# Patient Record
Sex: Female | Born: 1950 | Race: Black or African American | Hispanic: No | Marital: Married | Smoking: Never smoker
Health system: Southern US, Community
[De-identification: ages and names within clinical notes are randomized; demographics above are authoritative.]

## PROBLEM LIST (undated history)

## (undated) DIAGNOSIS — K219 Gastro-esophageal reflux disease without esophagitis: Secondary | ICD-10-CM

## (undated) DIAGNOSIS — E785 Hyperlipidemia, unspecified: Secondary | ICD-10-CM

## (undated) DIAGNOSIS — I10 Essential (primary) hypertension: Secondary | ICD-10-CM

## (undated) HISTORY — PX: ABDOMINAL HYSTERECTOMY: SHX81

---

## 2005-09-19 ENCOUNTER — Ambulatory Visit: Payer: Self-pay | Admitting: Family Medicine

## 2005-12-21 ENCOUNTER — Emergency Department (HOSPITAL_COMMUNITY): Admission: EM | Admit: 2005-12-21 | Discharge: 2005-12-21 | Payer: Self-pay | Admitting: Emergency Medicine

## 2007-04-30 ENCOUNTER — Ambulatory Visit: Payer: Self-pay | Admitting: Family Medicine

## 2007-04-30 DIAGNOSIS — N951 Menopausal and female climacteric states: Secondary | ICD-10-CM | POA: Insufficient documentation

## 2007-04-30 DIAGNOSIS — R5383 Other fatigue: Secondary | ICD-10-CM

## 2007-04-30 DIAGNOSIS — M129 Arthropathy, unspecified: Secondary | ICD-10-CM | POA: Insufficient documentation

## 2007-04-30 DIAGNOSIS — H269 Unspecified cataract: Secondary | ICD-10-CM

## 2007-04-30 DIAGNOSIS — J309 Allergic rhinitis, unspecified: Secondary | ICD-10-CM | POA: Insufficient documentation

## 2007-04-30 DIAGNOSIS — I10 Essential (primary) hypertension: Secondary | ICD-10-CM | POA: Insufficient documentation

## 2007-04-30 DIAGNOSIS — R5381 Other malaise: Secondary | ICD-10-CM

## 2007-04-30 DIAGNOSIS — K219 Gastro-esophageal reflux disease without esophagitis: Secondary | ICD-10-CM

## 2007-04-30 DIAGNOSIS — G43909 Migraine, unspecified, not intractable, without status migrainosus: Secondary | ICD-10-CM | POA: Insufficient documentation

## 2007-05-01 ENCOUNTER — Encounter (INDEPENDENT_AMBULATORY_CARE_PROVIDER_SITE_OTHER): Payer: Self-pay | Admitting: Family Medicine

## 2007-05-18 ENCOUNTER — Encounter (INDEPENDENT_AMBULATORY_CARE_PROVIDER_SITE_OTHER): Payer: Self-pay | Admitting: Family Medicine

## 2007-10-14 ENCOUNTER — Emergency Department (HOSPITAL_COMMUNITY): Admission: EM | Admit: 2007-10-14 | Discharge: 2007-10-14 | Payer: Self-pay | Admitting: Emergency Medicine

## 2008-02-27 ENCOUNTER — Ambulatory Visit: Payer: Self-pay | Admitting: Family Medicine

## 2008-02-27 ENCOUNTER — Telehealth (INDEPENDENT_AMBULATORY_CARE_PROVIDER_SITE_OTHER): Payer: Self-pay | Admitting: *Deleted

## 2008-02-27 ENCOUNTER — Ambulatory Visit (HOSPITAL_COMMUNITY): Admission: RE | Admit: 2008-02-27 | Discharge: 2008-02-27 | Payer: Self-pay | Admitting: Family Medicine

## 2008-02-27 DIAGNOSIS — M549 Dorsalgia, unspecified: Secondary | ICD-10-CM | POA: Insufficient documentation

## 2008-02-28 ENCOUNTER — Telehealth (INDEPENDENT_AMBULATORY_CARE_PROVIDER_SITE_OTHER): Payer: Self-pay | Admitting: *Deleted

## 2008-02-28 ENCOUNTER — Encounter (INDEPENDENT_AMBULATORY_CARE_PROVIDER_SITE_OTHER): Payer: Self-pay | Admitting: Family Medicine

## 2008-02-28 LAB — CONVERTED CEMR LAB
ALT: 17 units/L (ref 0–35)
AST: 16 units/L (ref 0–37)
Basophils Absolute: 0 10*3/uL (ref 0.0–0.1)
Basophils Relative: 1 % (ref 0–1)
Creatinine, Ser: 0.72 mg/dL (ref 0.40–1.20)
Eosinophils Relative: 1 % (ref 0–5)
HCT: 36.8 % (ref 36.0–46.0)
Hemoglobin: 12 g/dL (ref 12.0–15.0)
LDL Cholesterol: 113 mg/dL — ABNORMAL HIGH (ref 0–99)
MCHC: 32.6 g/dL (ref 30.0–36.0)
Monocytes Absolute: 0.3 10*3/uL (ref 0.1–1.0)
Neutro Abs: 1.8 10*3/uL (ref 1.7–7.7)
Platelets: 279 10*3/uL (ref 150–400)
RDW: 12.3 % (ref 11.5–15.5)
Sodium: 140 meq/L (ref 135–145)
TSH: 0.968 microintl units/mL (ref 0.350–5.50)
Total Bilirubin: 0.6 mg/dL (ref 0.3–1.2)
Total CHOL/HDL Ratio: 3.7
VLDL: 41 mg/dL — ABNORMAL HIGH (ref 0–40)

## 2008-05-07 ENCOUNTER — Emergency Department (HOSPITAL_COMMUNITY): Admission: EM | Admit: 2008-05-07 | Discharge: 2008-05-07 | Payer: Self-pay | Admitting: Emergency Medicine

## 2008-08-18 ENCOUNTER — Ambulatory Visit: Payer: Self-pay | Admitting: Cardiology

## 2008-08-19 ENCOUNTER — Observation Stay (HOSPITAL_COMMUNITY): Admission: EM | Admit: 2008-08-19 | Discharge: 2008-08-19 | Payer: Self-pay | Admitting: Emergency Medicine

## 2008-08-19 ENCOUNTER — Encounter (INDEPENDENT_AMBULATORY_CARE_PROVIDER_SITE_OTHER): Payer: Self-pay | Admitting: Internal Medicine

## 2008-08-21 ENCOUNTER — Encounter: Payer: Self-pay | Admitting: Cardiology

## 2008-08-21 ENCOUNTER — Ambulatory Visit (HOSPITAL_COMMUNITY): Admission: RE | Admit: 2008-08-21 | Discharge: 2008-08-21 | Payer: Self-pay | Admitting: Cardiology

## 2009-03-05 ENCOUNTER — Ambulatory Visit (HOSPITAL_COMMUNITY): Admission: RE | Admit: 2009-03-05 | Discharge: 2009-03-05 | Payer: Self-pay | Admitting: Internal Medicine

## 2009-03-05 ENCOUNTER — Encounter (INDEPENDENT_AMBULATORY_CARE_PROVIDER_SITE_OTHER): Payer: Self-pay | Admitting: Internal Medicine

## 2010-10-08 ENCOUNTER — Emergency Department (HOSPITAL_COMMUNITY): Admission: EM | Admit: 2010-10-08 | Discharge: 2010-10-08 | Payer: Self-pay | Admitting: Emergency Medicine

## 2010-12-12 ENCOUNTER — Encounter: Payer: Self-pay | Admitting: Internal Medicine

## 2010-12-12 ENCOUNTER — Encounter: Payer: Self-pay | Admitting: Family Medicine

## 2011-04-05 NOTE — Op Note (Signed)
NAMECAYLEY, PESTER           ACCOUNT NO.:  [id number]   MEDICAL RECORD NO.:  [id number]          PATIENT TYPE:  AMB   LOCATION:  DAY                           FACILITY:  [facility]   PHYSICIAN:  Lionel December, M.D.    DATE OF BIRTH:  [birth year]   DATE OF PROCEDURE:  DATE OF DISCHARGE:                               OPERATIVE REPORT   PROCEDURE:  Esophagogastroduodenoscopy.   INDICATION:  Arfa is a 60 year old African American female who has  had symptoms of GERD for about 2 years.  She has noted worsening  symptoms over the last 1 year.  She has also been experiencing some  chest pain.  She had noncardiac evaluation at [facility] during recent  hospitalization which was negative.  She is currently taking omeprazole  and still having heartburn, regurgitation as well as nocturnal coughing  spells.  She states she is not able to rest at night.  She denies  dysphagia, epigastric pain or melena.  She says she has changed her  eating habits completely and now she eats baked and boiled food.  She  does eat late at night.   Procedure was discussed with the patient and informed consent was  obtained.  After conscious sedation, oropharyngeal topical anesthesia  and Demerol 50 mg IV, Versed 8 mg IV in divided dose.   FINDINGS:  Procedure performed in endoscopy suite.  The patient's vital  signs and O2 sat were monitored during the procedure and remained  stable.  The patient was placed in the left lateral recumbent position  and the Pentax video scope was passed via oropharynx without any  difficulty into esophagus.   Esophagus mucosa:  The esophagus is normal.  No erosions or ulcers are  noted.  Gastroesophageal junction was located at 35 cm from the incision  and was serrated or wavy.  Biopsy was taken from this area on the way  out to rule out short-segment Barrett's.  Hiatus was at 38 cm.  Mucosa  of the herniated part of stomach was normal.   Stomach:  It was empty and distended  very well with insufflation.  Folds  of the proximal stomach are normal.  Examination of the mucosa at body  antrum:  Pyloric channel as well as annular fundus and cardio was  normal.   Duodenum:  Bulbar mucosa was normal.  Scope was passed in the second  part of the duodenum.  Mucosa and folds are normal.  Endoscope  withdrawn.  The patient tolerated the procedure well.   FINAL DIAGNOSES:  1. No evidence of erosive esophagitis.  2. Serrated or wavy gastroesophageal junction.  Biopsy taken to rule      out short-segment Barrett's.  3. Small sliding hiatal hernia.  4. Normal examination of the stomach, first and second part of the      duodenum.   RECOMMENDATIONS:  1. Antireflux measures reinforced.  2. The patient advised to eat her evening meal at least 3 hours before      she goes to bed.  3. Increase omeprazole to 20 mg before breakfast and before evening  meal.  New prescription given for 60 doses with 5 refills.  4. I will contact patient with results of biopsy and plan to see her      back in the office in 1 month.      Lionel December, M.D.  Electronically Signed     NR/MEDQ  D:  03/05/2009  T:  03/05/2009  Job:  [id number]   cc:   Mabeline Caras, MD

## 2011-04-05 NOTE — H&P (Signed)
Veronica Davis, [name]:  [id number]   MEDICAL RECORD NO.:  [id number]          PATIENT TYPE:  OBV   LOCATION:  ED99                          FACILITY:  [facility]   PHYSICIAN:  Osvaldo Shipper, MD     DATE OF BIRTH:  08-15-1951   DATE OF ADMISSION:  08/18/2008  DATE OF DISCHARGE:  LH                              HISTORY & PHYSICAL   PRIMARY CARE PHYSICIAN:  The patient's PMD, I think, is Veronica Davis, M.D. though it is very difficulty to say as the patient does  not know his name.   ADMITTING DIAGNOSES:  1. Chest pain.  2. History of hypertension.  3. Obesity.  4. Chronic back pain.  5. Hypokalemia.   CHIEF COMPLAINT:  Chest pain since this morning.   HISTORY OF THE PRESENT ILLNESS:  The patient is a 60 year old African  American female who has a past medical history of hypertension and  chronic back pain who was in her usual state of health until this  morning when she woke up, was helping her son go to school and she  suddenly experienced retrosternal chest pain, which was 10/10 in  intensity.  She felt as if somebody was sitting on her chest.  She had  one similar episode, she mentions, some time last year for which she  came into the ED and was diagnosed with acid reflux disease, and was  sent home.  The patient mentions that the pain persisted throughout the  day.  There is no radiation of the pain and she did not have any  shortness of breath.  The pain seems to increase when she lies down and  seems to change with different positions.  She sits down when she  experiences the pain.  She does not have any nausea or vomiting; though,  she did experience some palpitations.  She does have a small cough, but  no expectoration.  No fever or chills have been noted.   The patient, apart from the above-mentioned aggravating and relieving  aggravating factors, does not have any persistent factors.  No change in  the pain is noted with exertion.  The  patient mentions that she came  into the ED this evening as she did not have a ride to the emergency  department.  She was then given three nitroglycerins, after which she  had complete resolution of the pain.  She denies any food intake prior  to the onset of her pain.   MEDICATIONS:  The patient's medications at home include:  1. Aspirin 325 mg daily.  2. Naproxen 500 mg twice a day.  3. Hydrochlorothiazide 12.5 mg once a day.  4. The patient takes Premarin on a daily basis.  5. Motrin for migraines.   ALLERGIES:  The patient's allergies include PENICILLIN, WHICH CAUSES  SWELLING OF HER NECK.   PAST MEDICAL HISTORY:  The past medical history is positive for:  1. Hypertension.  2. Migraine headaches.  3. Chronic back pain.  4. The patient has hot flashes for which she is on Premarin.   PAST SURGICAL HISTORY:  The surgical history  includes a hysterectomy.   SOCIAL HISTORY:  The patient lives in [place] with her family.  She  is a Press photographer.  No smoking, no alcohol use and no illicit  drug use.  She is independent in her daily activities.   FAMILY HISTORY:  The patient's father had diabetes; however, he expired  due to unknown reasons.  Mother died of a heart attack in her 22s.  She  has two brothers whom she does not know much about their health history  and she has two sisters; one has some sort of unknown heart disease and  diabetes.   REVIEW OF SYSTEMS:  CONSTITUTIONAL:  General review of systems is  positive for weakness and malaise.  HEENT:  Unremarkable.  CARDIOVASCULAR: As noted in the HPI.  RESPIRATORY:  As noted in the HPI.  GASTROINTESTINAL:  Positive for symptoms of heartburn, etc, otherwise  unremarkable.  GENITOURINARY:  Unremarkable.  MUSCULOSKELETAL:  Positive  for back pain, which is chronic.  NEUROLOGIC:  Unremarkable.  PSYCHIATRIC:  Unremarkable.  DERMATOLOGIC:  Unremarkable.  Other systems  are unremarkable.   PHYSICAL EXAMINATION:  VITAL  SIGNS:  Temperature 98.4.  Blood pressure  when she came in was 180/94 and improving to 115/76.  Heart rate in the  80s.  Respiratory rate 14.  Saturation 100% on room air.  GENERAL APPEARANCE:  On general exam this is an obese black female  currently in no distress.  HEENT:  There is no pallor.  No icterus.  Oral mucous membranes are  moist.  No oral lesions are noted.  NECK:  The neck is soft and supple.  No thyromegaly is appreciated.  LUNGS:  The lungs are clear to auscultation bilaterally.  No wheezes,  rales or rhonchi.  HEART:  Cardiovascularly S1 and S2 are normal.  Regular.  No murmurs  appreciated.  No S3 o4 S4.  No rubs are heard.  ABDOMEN:  The abdomen is soft, nontender and nondistended.  Bowel sounds  are present.  No masses or organomegaly are appreciated.  CHEST:  In the chest wall there is  tenderness to palpation, but it is  not clear if the palpation reproduced the pain or not.  EXTREMITIES:  The extremities do not show any edema.  GENITALIA:  Genitourinary exam is deferred.  NEUROLOGIC EXAMINATION:  Neurologically she is alert and oriented times  three.  No focal neurological deficits are present.  MUSCULOSKELETAL:  The musculoskeletal exam is unremarkable.   LABORATORY DATA:  The patient has an unremarkable CBC.  She has a  potassium of 3.0 and glucose is 127; this is a nonfasting level.  Cardiac markers are negative times two.  She had a chest x-ray, which  did not show any acute cardiopulmonary process.  She had an EKG, which  showed a sinus rhythm with normal axis.  Intervals appear to be in the  normal range.  No Q waves are present.  No concerning ST or T wave  changes are seen on this EKG.   ASSESSMENT:  This is a 60 year old African American female who has a  past medical history of hypertension who is obese and presents with  chest pain.  The pain started this morning; if felt like somebody was  sitting on her chest.  The pain was relieved with  nitroglycerin.  So the  differential diagnosis at this time include:  1. Coronary artery disease.  2. Esophageal  spasm.  3. Acid reflux disease.  4. Musculoskeletal.  5. Biliary  process.   The patient needs observation in the hospital and further evaluation.   PLAN:  1. Chest pain.  As above, we will observe her in the hospital on      telemetry rule her out for an acute coronary syndrome with serial      cardiac markers.  EKGs will be repeated.  Lipid profile will be      checked.  TSH will be checked.  We will also consult [facility] because the chest pain was relieved with nitroglycerin.      She will likely need noninvasive cardiac testing either as an      inpatient or as an outpatient.  We will continue with the aspirin      and because her blood pressure is still a little bit high we will      initiate low-dose beta blockers.  We will also check LFTs and      lipase.  2. History of hypertension.  Continue current antihypertensive agents      and add beta blockers.  3. Hypokalemia.  We will check a magnesium level and replace the      potassium with by mouth Kay-Ciel.  4. DVT prophylaxis will be initiated.  Proton pump inhibitor will be      started as well.   Further management decisions will depend on the results of further  testing and the patient's response to treatment.      Osvaldo Shipper, MD  Electronically Signed     GK/MEDQ  D:  08/19/2008  T:  08/19/2008  Job:  [id number]   cc:   Veronica Davis, M.D.

## 2011-04-05 NOTE — Consult Note (Signed)
Veronica Davis, Veronica [name]:  [id number]   MEDICAL RECORD NO.:  [id number]          PATIENT TYPE:  OBV   LOCATION:  A306                          FACILITY:  [facility]   PHYSICIAN:  Gerrit Friends. Dietrich Pates, MD, FACCDATE OF BIRTH:  02-19-1951   DATE OF CONSULTATION:  08/19/2008  DATE OF DISCHARGE:  08/19/2008                                 CONSULTATION   REFERRING PHYSICIAN:  Dr. Osvaldo Shipper of the [facility] Hospitalist  Team P.   PATIENT'S PRIMARY CARE PHYSICIAN:  She is unaware of her name.   CARDIOLOGIST:  She will be new to Dr. Mingo Bing.   REASON FOR REFERRAL:  Chest pain.   HISTORY OF PRESENT ILLNESS:  Ms. Haviland is a 60 year old female  patient with no known history of coronary artery disease who presented  to [facility] Emergency Room last night with complaints of  chest pain.  Her pain began yesterday morning shortly after awakening.  The pain was intermittent and continued to recur throughout the day.  She denied any radiation or associated shortness of breath, nausea or  diaphoresis.  She denied any syncope.  She notes that the pain was at  its worst when she  would lay down.  She ate around 8 o'clock yesterday  evening and noted worsening pain around 9:30.  This prompted her to come  to the emergency room.  She apparently received nitroglycerin in the  emergency room with relief of her symptoms.  She had a similar episode  of chest pain about 2 years ago and was told that she had acid reflux  disease.  She is currently pain free.  Her cardiac enzymes have been  negative thus far and her electrocardiogram is unremarkable.  Her chest  x-ray shows no active disease.  We are asked to further evaluate.   PAST MEDICAL HISTORY:  1. Hypertension.  2. History of hyperlipidemia, untreated.  3. GERD.  4. Migraine headaches.  5. Chronic low back pain.  6. She is status post total abdominal hysterectomy.   MEDICATIONS PRIOR TO ADMISSION:  1.  Hydrochlorothiazide 12.5 mg daily.  2. Naprosyn 500 mg b.i.d.  3. Aspirin daily.  4. Premarin daily.   ALLERGIES:  PENICILLIN.   SOCIAL HISTORY:  The patient lives in [place] with her husband.  She  is disabled secondary to back pain.  She denies tobacco, alcohol or drug  abuse.   FAMILY HISTORY:  Significant for CAD.  Her mother died of a myocardial  infarction in her 91s.  Her father is deceased and had a history of  diabetes and hypertension.   REVIEW OF SYSTEMS:  Please see HPI.  She denies fevers, chills, dysuria,  hematuria, bright red blood per rectum, melena, dysphagia.  She notes  nonproductive cough.  She notes chronic edema without change.  She does  note some snoring with questionable daytime hypersomnolence.  She denies  orthopnea, PND or pedal edema.  Rest of the review of systems is  negative.   PHYSICAL EXAM:  She is a well-nourished, well-developed female.  Blood  pressure is 180/92 upon presentation to the emergency room  yesterday.  Now it is 149/74, pulse 74, respirations 18, temperature 98.4, oxygen  saturation 99% on room air.  HEENT:  Normal.  NECK:  Without JVD.  LYMPH:  Without lymphadenopathy.  ENDOCRINE:  Without thyromegaly.  CARDIAC:  Normal S1, S2.  Regular rate and rhythm with a 1/6 systolic  ejection murmur best heard at the right upper sternal border.  LUNGS:  Clear to auscultation bilaterally without wheezes.  SKIN:  Without rash.  ABDOMEN:  Soft with normoactive bowel sounds.  No organomegaly.  She  does have epigastric tenderness with palpation.  EXTREMITIES:  With trace edema bilaterally.  MUSCULOSKELETAL:  Without joint deformity.  NEUROLOGIC:  She is alert and oriented x3.  Cranial nerves II-XII  grossly intact.  VASCULAR:  Without carotid bruits bilaterally.   Chest x-ray:  No active disease.  EKG:  Normal sinus rhythm with a heart  rate of 71, no ischemic changes, normal axis.   LABORATORIES:  White count 5300, hemoglobin is 12,  hematocrit 34.1,  platelet count 297,000, potassium 3.3, creatinine 0.6. Coronary care CK-  MB 2.3, 1.6.  Point of care troponin-I less than 0.05, less than 0.05.  CK 127, CK-MB 2.6, troponin-I was 0.02, magnesium 0.5.   ASSESSMENT/PLAN:  1. Atypical chest pain in a 60 year old female patient with a history      of hypertension and remote family history of CAD.  She is currently      pain free and her cardiac enzymes have been negative so far.  Her      EKG is unremarkable.  TSH, lipid panel and 2-D echocardiogram are      currently pending.  She is currently on aspirin therapy.  She has      been interviewed and examined by Dr. Dietrich Pates.  At this point, he      has suggested that the patient may be discharged to home.  She will      be set up for an outpatient stress echocardiogram to rule out      ischemic heart disease.  2. Hypertension.  The patient has been started on metoprolol in      addition to her hydrochlorothiazide.   Thank you very much for the consultation.  As noted above, the patient  can be discharged to home from a cardiovascular standpoint with plans  for followup stress testing.      Tereso Newcomer, PA-C      Gerrit Friends. Dietrich Pates, MD, [facility]  Electronically Signed    SW/MEDQ  D:  08/19/2008  T:  08/19/2008  Job:  [id number]   cc:   Osvaldo Shipper, MD

## 2011-08-22 LAB — BASIC METABOLIC PANEL
CO2: 29
Calcium: 9.4
Creatinine, Ser: 0.61
GFR calc Af Amer: >60
GFR calc non Af Amer: >60
GFR calc non Af Amer: >60
Potassium: 3.3 — ABNORMAL LOW
Sodium: 139

## 2011-08-22 LAB — LIPID PANEL
Cholesterol: 210 — ABNORMAL HIGH
Total CHOL/HDL Ratio: 4.8
VLDL: 38

## 2011-08-22 LAB — POCT CARDIAC MARKERS
Myoglobin, poc: 55.4
Myoglobin, poc: 73.6
Troponin i, poc: <0.05

## 2011-08-22 LAB — DIFFERENTIAL
Basophils Relative: 0
Monocytes Relative: 6
Neutro Abs: 2.6
Neutrophils Relative %: 49

## 2011-08-22 LAB — CK TOTAL AND CKMB (NOT AT ARMC)
CK, MB: 2.6
Relative Index: 2
Total CK: 127

## 2011-08-22 LAB — MAGNESIUM: Magnesium: 2.5

## 2011-08-22 LAB — CBC
MCHC: 35.3
RBC: 3.86 — ABNORMAL LOW
WBC: 5.3

## 2011-08-22 LAB — HEPATIC FUNCTION PANEL
AST: 21
Albumin: 3.9
Total Bilirubin: 0.7

## 2011-08-22 LAB — TROPONIN I: Troponin I: 0.02

## 2016-09-21 ENCOUNTER — Emergency Department (HOSPITAL_COMMUNITY)
Admission: EM | Admit: 2016-09-21 | Discharge: 2016-09-21 | Disposition: A | Discharge status: Home / Self Care | Payer: Medicare Other | Attending: Emergency Medicine | Admitting: Emergency Medicine

## 2016-09-21 ENCOUNTER — Encounter (HOSPITAL_COMMUNITY): Payer: Self-pay | Admitting: *Deleted

## 2016-09-21 DIAGNOSIS — Z79899 Other long term (current) drug therapy: Secondary | ICD-10-CM | POA: Insufficient documentation

## 2016-09-21 DIAGNOSIS — L0231 Cutaneous abscess of buttock: Secondary | ICD-10-CM | POA: Diagnosis not present

## 2016-09-21 DIAGNOSIS — I10 Essential (primary) hypertension: Secondary | ICD-10-CM | POA: Insufficient documentation

## 2016-09-21 DIAGNOSIS — Z7982 Long term (current) use of aspirin: Secondary | ICD-10-CM | POA: Diagnosis not present

## 2016-09-21 HISTORY — DX: Gastro-esophageal reflux disease without esophagitis: K21.9

## 2016-09-21 MED ORDER — DOXYCYCLINE HYCLATE 100 MG PO CAPS: 100.0000 mg | ORAL_CAPSULE | Freq: Two times a day (BID) | ORAL | 0 refills | Status: DC

## 2016-09-21 MED ORDER — HYDROCODONE-ACETAMINOPHEN 5-325 MG PO TABS
1.0000 | ORAL_TABLET | Freq: Once | ORAL | Status: AC
  Administered 2016-09-21: 1 via ORAL
  Filled 2016-09-21: qty 1

## 2016-09-21 MED ORDER — LIDOCAINE HCL (PF) 1 % IJ SOLN
5.0000 mL | Freq: Once | INTRAMUSCULAR | Status: DC
  Filled 2016-09-21: qty 5

## 2016-09-21 MED ORDER — DOXYCYCLINE HYCLATE 100 MG PO TABS
100.0000 mg | ORAL_TABLET | Freq: Once | ORAL | Status: AC
  Administered 2016-09-21: 100 mg via ORAL
  Filled 2016-09-21: qty 1

## 2016-09-21 NOTE — Discharge Instructions (Signed)
Please take the antibiotics twice a day for 7 days. Return to the Ed in 2-3 days for wound recheck. Follow up with a pcp to establish care. Return to the Ed sooner if your symptoms worsen or if you develop a fever, vomiting, worsening pain, redness or swelling.

## 2016-09-21 NOTE — ED Triage Notes (Signed)
Pt reports very painful "boil" on her posterior left thigh near her butttocks with draining fluid x 4 days.

## 2016-09-21 NOTE — ED Provider Notes (Signed)
[facility]-EMERGENCY DEPT Provider Note   CSN: [id number] Arrival date & time: 09/21/16  1801     History   Chief Complaint Chief Complaint  Patient presents with  . Abscess    HPI Veronica Davis is a 65 y.o. female.  65 year old African-American female with no significant past medical history presents to the ED today with abscess to left inferior buttock's. Patient states that she noticed a boil there approximately one week ago. States that it started draining approximately 4 days ago on its own. She denies any trauma to the area. She denies any fevers or chills. Patient has not tried anything at home for the pain. The pain is acute in onset and gradually worsening. Sitting on her butt makes the pain worse. Nothing makes the pain better. She denies any other complaints.  Patient with history of hypertensin and not currently on meds.      Past Medical History:  Diagnosis Date  . GERD (gastroesophageal reflux disease)     Patient Active Problem List   Diagnosis Date Noted  . BACK PAIN 02/27/2008  . MIGRAINE HEADACHE 04/30/2007  . 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 HYPERTENSION 04/30/2007  . ALLERGIC RHINITIS 04/30/2007  . GERD 04/30/2007  . HOT FLASHES 04/30/2007  . ARTHRITIS 04/30/2007  . MALAISE AND FATIGUE 04/30/2007    Past Surgical History:  Procedure Laterality Date  . ABDOMINAL HYSTERECTOMY      OB History    No data available       Home Medications    Prior to Admission medications   Medication Sig Start Date End Date Taking? Authorizing Provider  aspirin EC 81 MG tablet Take 81 mg by mouth daily.   Yes Historical Provider, MD  Iron-Vitamins (GERITOL COMPLETE) TABS Take 1 tablet by mouth daily.   Yes Historical Provider, MD    Family History No family history on file.  Social History Social History  Substance Use Topics  . Smoking status: Never Smoker  . Smokeless tobacco: Never Used  . Alcohol use No     Allergies   Penicillins   Review of  Systems Review of Systems  Constitutional: Negative for chills and fever.  HENT: Negative for congestion and rhinorrhea.   Eyes: Negative.   Respiratory: Negative for cough.   Cardiovascular: Negative for chest pain.  Gastrointestinal: Negative for abdominal pain, diarrhea, nausea and vomiting.  Genitourinary: Negative.   Musculoskeletal: Negative.   Skin: Positive for wound.  Neurological: Negative for dizziness and headaches.  All other systems reviewed and are negative.    Physical Exam Updated Vital Signs BP 145/73   Pulse 110   Temp 98.9 F (37.2 C) (Oral)   Resp 17   Ht 5\' 4"  (1.626 m)   Wt 82.5 kg   SpO2 98%   BMI 31.21 kg/m   Physical Exam  Constitutional: She appears well-developed and well-nourished. No distress.  Eyes: Right eye exhibits no discharge. Left eye exhibits no discharge. No scleral icterus.  Pulmonary/Chest: No respiratory distress.  Musculoskeletal: Normal range of motion.  Neurological: She is alert.  Skin: Skin is warm and dry. Capillary refill takes less than 2 seconds. No pallor.     Nursing note and vitals reviewed.    ED Treatments / Results  Labs (all labs ordered are listed, but only abnormal results are displayed) Labs Reviewed - No data to display  EKG  EKG Interpretation None       Radiology No results found.  Procedures .Marland Kitchen.Incision and Drainage Date/Time: 09/22/2016  9:44 PM Performed by: Rise MuLEAPHART, Nataliee Shurtz T Authorized by: Demetrios LollLEAPHART, Destinee Taber T   Consent:    Consent obtained:  Verbal   Consent given by:  Patient   Risks discussed:  Bleeding, damage to other organs, infection, incomplete drainage and pain   Alternatives discussed:  No treatment Location:    Type:  Abscess   Size:  6cm   Location: left inner groin. Pre-procedure details:    Skin preparation:  Betadine Anesthesia (see MAR for exact dosages):    Anesthesia method:  Local infiltration   Local anesthetic:  Lidocaine 1% w/o epi Procedure type:     Complexity:  Simple Procedure details:    Needle aspiration: no     Incision types:  Single straight   Incision depth:  Dermal   Scalpel blade:  11   Wound management:  Probed and deloculated and irrigated with saline   Drainage:  Bloody and purulent   Drainage amount:  Moderate   Wound treatment:  Wound left open   Packing materials:  1/4 in iodoform gauze   Amount 1/4" iodoform:  2in Post-procedure details:    Patient tolerance of procedure:  Tolerated well, no immediate complications Comments:     Patient provided wound care and encouraged to return to ED in 2-3 days for wound recheck   (including critical care time)  Medications Ordered in ED Medications  HYDROcodone-acetaminophen (NORCO/VICODIN) 5-325 MG per tablet 1 tablet (1 tablet Oral Given 09/21/16 2351)  doxycycline (VIBRA-TABS) tablet 100 mg (100 mg Oral Given 09/21/16 2350)     Initial Impression / Assessment and Plan / ED Course  I have reviewed the triage vital signs and the nursing notes.  Pertinent labs & imaging results that were available during my care of the patient were reviewed by me and considered in my medical decision making (see chart for details).  Clinical Course  Patient with skin abscess amenable to incision and drainage.  Packing placed. Wound recheck in 2 days. Encouraged home warm soaks and flushing.  Mild signs of cellulitis is surrounding skin. Given location and size of cellulitis started patient on abx. Patient with pen allergies. Spoke with pharmacist who recommends doxy as second line. Patient tachycardic and hypertension in ED. Likely due to pain. Patient is afebrile. History of htn not on meds. Encouraged her to follow up with pcp for blood pressure recheck. Patient verbalized understanding with plan of care. Discharged home in NAD. Strict return precautions given.    Final Clinical Impressions(s) / ED Diagnoses   Final diagnoses:  Abscess of buttock, right    New  Prescriptions Discharge Medication List as of 09/21/2016 11:44 PM    START taking these medications   Details  doxycycline (VIBRAMYCIN) 100 MG capsule Take 1 capsule (100 mg total) by mouth 2 (two) times daily., Starting Wed 09/21/2016, Print         Rise MuKenneth T Kaye Luoma, PA-C 09/22/16 2150    Jacalyn LefevreJulie Haviland, MD 09/22/16 [phone]

## 2016-10-06 ENCOUNTER — Emergency Department (HOSPITAL_COMMUNITY)
Admission: EM | Admit: 2016-10-06 | Discharge: 2016-10-06 | Disposition: A | Discharge status: Home / Self Care | Payer: Medicare Other | Attending: Emergency Medicine | Admitting: Emergency Medicine

## 2016-10-06 ENCOUNTER — Encounter (HOSPITAL_COMMUNITY): Payer: Self-pay | Admitting: Emergency Medicine

## 2016-10-06 DIAGNOSIS — Z79899 Other long term (current) drug therapy: Secondary | ICD-10-CM | POA: Diagnosis not present

## 2016-10-06 DIAGNOSIS — Z7982 Long term (current) use of aspirin: Secondary | ICD-10-CM | POA: Insufficient documentation

## 2016-10-06 DIAGNOSIS — I10 Essential (primary) hypertension: Secondary | ICD-10-CM | POA: Insufficient documentation

## 2016-10-06 DIAGNOSIS — Z4801 Encounter for change or removal of surgical wound dressing: Secondary | ICD-10-CM | POA: Diagnosis not present

## 2016-10-06 DIAGNOSIS — Z4800 Encounter for change or removal of nonsurgical wound dressing: Secondary | ICD-10-CM | POA: Diagnosis not present

## 2016-10-06 DIAGNOSIS — Z5189 Encounter for other specified aftercare: Secondary | ICD-10-CM

## 2016-10-06 MED ORDER — IBUPROFEN 400 MG PO TABS
400.0000 mg | ORAL_TABLET | Freq: Four times a day (QID) | ORAL | 0 refills | Status: DC | PRN
Start: 2016-10-06 — End: 2020-11-03

## 2016-10-06 NOTE — ED Provider Notes (Signed)
[facility] DEPT Provider Note   CSN: [id number] Arrival date & time: 10/06/16  1049  History   Chief Complaint Chief Complaint  Patient presents with  . Abscess    HPI Veronica Davis is a 65 y.o. female.  HPI  65 y.o. female presents to the Emergency Department today four wound check. Seen on 09-21-16 for abscess behind left leg. Drained in ED and given Rx Doxy for ABX. Unable to get check in 2-3 days due to transportation. Presents today due to continued pain. Notes pain 10/10. No purulence from wound. No erythema. NO fevers at home. No N/V. Notes using hydrogen peroxide daily for wound as well as topical neosporin. No other symptoms noted.     Past Medical History:  Diagnosis Date  . GERD (gastroesophageal reflux disease)     Patient Active Problem List   Diagnosis Date Noted  . BACK PAIN 02/27/2008  . MIGRAINE HEADACHE 04/30/2007  . 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 HYPERTENSION 04/30/2007  . ALLERGIC RHINITIS 04/30/2007  . GERD 04/30/2007  . HOT FLASHES 04/30/2007  . ARTHRITIS 04/30/2007  . MALAISE AND FATIGUE 04/30/2007    Past Surgical History:  Procedure Laterality Date  . ABDOMINAL HYSTERECTOMY      OB History    No data available       Home Medications    Prior to Admission medications   Medication Sig Start Date End Date Taking? Authorizing Provider  aspirin EC 81 MG tablet Take 81 mg by mouth daily.    Historical Provider, MD  doxycycline (VIBRAMYCIN) 100 MG capsule Take 1 capsule (100 mg total) by mouth 2 (two) times daily. 09/21/16   Rise MuKenneth T Leaphart, PA-C  Iron-Vitamins (GERITOL COMPLETE) TABS Take 1 tablet by mouth daily.    Historical Provider, MD    Family History History reviewed. No pertinent family history.  Social History Social History  Substance Use Topics  . Smoking status: Never Smoker  . Smokeless tobacco: Never Used  . Alcohol use No     Allergies   Penicillins   Review of Systems Review of Systems    Constitutional: Negative for fever.  Gastrointestinal: Negative for nausea and vomiting.  Skin: Positive for wound.   Physical Exam Updated Vital Signs BP 167/91 (BP Location: Left Arm)   Pulse 94   Temp 98.1 F (36.7 C) (Oral)   Resp 18   Ht 5\' 4"  (1.626 m)   Wt 82.1 kg   SpO2 100%   BMI 31.07 kg/m   Physical Exam  Constitutional: She is oriented to person, place, and time. Vital signs are normal. She appears well-developed and well-nourished.  HENT:  Head: Normocephalic.  Right Ear: Hearing normal.  Left Ear: Hearing normal.  Eyes: Conjunctivae and EOM are normal. Pupils are equal, round, and reactive to light.  Neck: Normal range of motion. Neck supple.  Cardiovascular: Normal rate, regular rhythm, normal heart sounds and intact distal pulses.   Pulmonary/Chest: Effort normal and breath sounds normal.  Abdominal: Soft.  Neurological: She is alert and oriented to person, place, and time.  Skin: Skin is warm and dry.  Left poster superior leg with well healed Incision and Drainage site. No surrounding erythema. No induration or fluctuance palpable. NO drainage. No signs of infection.   Psychiatric: She has a normal mood and affect. Her speech is normal and behavior is normal. Thought content normal.  Nursing note and vitals reviewed.  ED Treatments / Results  Labs (all labs ordered are listed, but only  abnormal results are displayed) Labs Reviewed - No data to display  EKG  EKG Interpretation None       Radiology No results found.  Procedures Procedures (including critical care time)  Medications Ordered in ED Medications - No data to display   Initial Impression / Assessment and Plan / ED Course  I have reviewed the triage vital signs and the nursing notes.  Pertinent labs & imaging results that were available during my care of the patient were reviewed by me and considered in my medical decision making (see chart for details).  Clinical Course     Final Clinical Impressions(s) / ED Diagnoses  I have reviewed the relevant previous healthcare records. I obtained HPI from historian.  ED Course:  Assessment: Pt is a 65yF who presents for wound check from previous I and D on 09-21-16. Given Doxy on DC. On exam, pt in NAD. Nontoxic/nonseptic appearing. VSS. Afebrile. Lungs CTA. Heart RRR. Are of concern well healed. Minor wound present. No surrounding erythema. No purulence. No induration or fluctuance palpable. Counseled on DC of hydrogen peroxide. Neosporin only with clean bandage. Wash daily. Plan is to DC home with follow up to PCP. At time of discharge, Patient is in no acute distress. Vital Signs are stable. Patient is able to ambulate. Patient able to tolerate PO.   Disposition/Plan:  DC Home Additional Verbal discharge instructions given and discussed with patient.  Pt Instructed to f/u with PCP in the next week for evaluation and treatment of symptoms. Return precautions given Pt acknowledges and agrees with plan  Supervising Physician Samuel JesterKathleen McManus, DO   Final diagnoses:  Wound check, abscess    New Prescriptions New Prescriptions   No medications on file     Audry Piliyler Leiam Hopwood, PA-C 10/06/16 1149    Samuel JesterKathleen McManus, DO 10/08/16 2025

## 2016-10-06 NOTE — Discharge Instructions (Signed)
Please read and follow all provided instructions.  Your diagnoses today include:  1. Wound check, abscess    Tests performed today include: Vital signs. See below for your results today.   Medications prescribed:  Take as prescribed   Home care instructions:  Follow any educational materials contained in this packet.  Follow-up instructions: Please follow-up with your primary care provider for further evaluation of symptoms and treatment   Return instructions:  Please return to the Emergency Department if you do not get better, if you get worse, or new symptoms OR  - Fever (temperature greater than 101.59F)  - Bleeding that does not stop with holding pressure to the area    -Severe pain (please note that you may be more sore the day after your accident)  - Chest Pain  - Difficulty breathing  - Severe nausea or vomiting  - Inability to tolerate food and liquids  - Passing out  - Skin becoming red around your wounds  - Change in mental status (confusion or lethargy)  - New numbness or weakness    Please return if you have any other emergent concerns.  Additional Information:  Your vital signs today were: BP 167/91 (BP Location: Left Arm)    Pulse 94    Temp 98.1 F (36.7 C) (Oral)    Resp 18    Ht 5\' 4"  (1.626 m)    Wt 82.1 kg    SpO2 100%    BMI 31.07 kg/m  If your blood pressure (BP) was elevated above 135/85 this visit, please have this repeated by your doctor within one month. ---------------

## 2016-10-06 NOTE — ED Notes (Signed)
Patient given discharge instruction, verbalized understand. Patient ambulatory out of the department.  

## 2016-10-06 NOTE — ED Triage Notes (Signed)
Pt states she is here for a recheck of the boil on her left leg.  States it does not seem to be any better.

## 2017-08-10 DIAGNOSIS — I10 Essential (primary) hypertension: Secondary | ICD-10-CM | POA: Diagnosis not present

## 2017-08-10 DIAGNOSIS — Z90711 Acquired absence of uterus with remaining cervical stump: Secondary | ICD-10-CM | POA: Diagnosis not present

## 2017-08-10 DIAGNOSIS — M539 Dorsopathy, unspecified: Secondary | ICD-10-CM | POA: Diagnosis not present

## 2017-08-10 DIAGNOSIS — K219 Gastro-esophageal reflux disease without esophagitis: Secondary | ICD-10-CM | POA: Diagnosis not present

## 2020-10-05 ENCOUNTER — Other Ambulatory Visit: Payer: Self-pay

## 2020-10-05 ENCOUNTER — Ambulatory Visit (INDEPENDENT_AMBULATORY_CARE_PROVIDER_SITE_OTHER): Payer: Medicare Other | Admitting: Internal Medicine

## 2020-10-05 ENCOUNTER — Encounter: Payer: Self-pay | Admitting: Internal Medicine

## 2020-10-05 VITALS — BP 188/95 | HR 96 | Temp 97.7°F | Resp 18 | Ht 64.0 in | Wt 186.0 lb

## 2020-10-05 DIAGNOSIS — Z7689 Persons encountering health services in other specified circumstances: Secondary | ICD-10-CM | POA: Diagnosis not present

## 2020-10-05 DIAGNOSIS — G8929 Other chronic pain: Secondary | ICD-10-CM | POA: Diagnosis not present

## 2020-10-05 DIAGNOSIS — I10 Essential (primary) hypertension: Secondary | ICD-10-CM

## 2020-10-05 DIAGNOSIS — M545 Low back pain, unspecified: Secondary | ICD-10-CM | POA: Diagnosis not present

## 2020-10-05 DIAGNOSIS — Z1231 Encounter for screening mammogram for malignant neoplasm of breast: Secondary | ICD-10-CM

## 2020-10-05 DIAGNOSIS — Z1211 Encounter for screening for malignant neoplasm of colon: Secondary | ICD-10-CM | POA: Diagnosis not present

## 2020-10-05 DIAGNOSIS — N951 Menopausal and female climacteric states: Secondary | ICD-10-CM | POA: Diagnosis not present

## 2020-10-05 DIAGNOSIS — Z780 Asymptomatic menopausal state: Secondary | ICD-10-CM | POA: Diagnosis not present

## 2020-10-05 DIAGNOSIS — Z23 Encounter for immunization: Secondary | ICD-10-CM | POA: Diagnosis not present

## 2020-10-05 MED ORDER — DULOXETINE HCL 30 MG PO CPEP
30.0000 mg | ORAL_CAPSULE | Freq: Every day | ORAL | 3 refills | Status: DC
Start: 2020-10-05 — End: 2020-10-12

## 2020-10-05 MED ORDER — ESTRADIOL 1 MG PO TABS: 1.0000 mg | ORAL_TABLET | Freq: Every day | ORAL | 2 refills | Status: DC

## 2020-10-05 MED ORDER — OLMESARTAN-AMLODIPINE-HCTZ 40-5-25 MG PO TABS: 1.0000 | ORAL_TABLET | Freq: Every day | ORAL | 3 refills | Status: DC

## 2020-10-05 NOTE — Assessment & Plan Note (Signed)
Care established Previous chart reviewed History and medications reviewed with the patient 

## 2020-10-05 NOTE — Assessment & Plan Note (Signed)
Related to menopause Started Estradiol 1 mg QD

## 2020-10-05 NOTE — Assessment & Plan Note (Addendum)
BP Readings from Last 1 Encounters:  10/05/20 (!) 188/95   No symptoms of end-organ damage Started Olmesartan-Amlodipine-HCTZ Counseled for compliance with the medications Advised DASH diet and moderate exercise/walking, at least 150 mins/week  EKG: Sinus rhythm. HR 93. Nonspecific ST and T wave abnormality.

## 2020-10-05 NOTE — Progress Notes (Signed)
New Patient Office Visit  Subjective:  Patient ID: Veronica Davis, female    DOB: 1951-04-29  Age: 69 y.o. MRN: [id number]  CC:  Chief Complaint  Patient presents with  . New Patient (Initial Visit)    new pt has been having hot flashes and needs something for this     HPI [name] is a 69 year old female with PMH of GERD and chronic low back pain who presents for establishing care.  Patient states that she has been in good health overall.  She denies any active complaints except chronic low back pain.  Patient's BP was 188/95 in the office today.  On repeat check, it was 195/100.  Patient mentions that she was told about high BP in the past and was taking her medication many years ago.  She currently takes aspirin 81 mg once daily.  She mentions headaches, which are generalized, intermittent, not associated with blurry vision, photophobia or phonophobia or watery eyes.  Of note, she mentions history of migraine in the past.  She denies any dizziness, chest pain, dyspnea or palpitations.  She complains of chronic low back pain, which is intermittent, sharp, with occasional radiation to left LE.  She takes ibuprofen as needed for back pain.  She denies any recent injury, lifting any heavy weight, urinary or bowel incontinence.  Patient denies any recent weight loss.  She has not had any colonoscopy yet.  Last Mammography about 5 years ago.  Patient has had both doses of COVID vaccines.  Patient received flu vaccine in the office today.    Past Medical History:  Diagnosis Date  . GERD (gastroesophageal reflux disease)     Past Surgical History:  Procedure Laterality Date  . ABDOMINAL HYSTERECTOMY      History reviewed. No pertinent family history.  Social History   Socioeconomic History  . Marital status: Married    Spouse name: Not on file  . Number of children: Not on file  . Years of education: Not on file  . Highest education level: Not on file   Occupational History  . Not on file  Tobacco Use  . Smoking status: Never Smoker  . Smokeless tobacco: Never Used  Substance and Sexual Activity  . Alcohol use: No  . Drug use: No  . Sexual activity: Not on file  Other Topics Concern  . Not on file  Social History Narrative  . Not on file   Social Determinants of Health   Financial Resource Strain:   . Difficulty of Paying Living Expenses: Not on file  Food Insecurity:   . Worried About Charity fundraiser in the Last Year: Not on file  . Ran Out of Food in the Last Year: Not on file  Transportation Needs:   . Lack of Transportation (Medical): Not on file  . Lack of Transportation (Non-Medical): Not on file  Physical Activity:   . Days of Exercise per Week: Not on file  . Minutes of Exercise per Session: Not on file  Stress:   . Feeling of Stress : Not on file  Social Connections:   . Frequency of Communication with Friends and Family: Not on file  . Frequency of Social Gatherings with Friends and Family: Not on file  . Attends Religious Services: Not on file  . Active Member of Clubs or Organizations: Not on file  . Attends Archivist Meetings: Not on file  . Marital Status: Not on file  Intimate Partner Violence:   .  Fear of Current or Ex-Partner: Not on file  . Emotionally Abused: Not on file  . Physically Abused: Not on file  . Sexually Abused: Not on file    ROS Review of Systems  Constitutional: Negative for chills and fever.  HENT: Negative for congestion, sinus pressure, sinus pain and sore throat.   Eyes: Negative for pain and discharge.  Respiratory: Negative for cough and shortness of breath.   Cardiovascular: Negative for chest pain and palpitations.  Gastrointestinal: Negative for abdominal pain, constipation, diarrhea, nausea and vomiting.  Endocrine: Negative for polydipsia and polyuria.  Genitourinary: Negative for dysuria and hematuria.  Musculoskeletal: Positive for back pain.  Negative for neck pain and neck stiffness.  Skin: Negative for rash.  Neurological: Negative for dizziness, syncope, weakness and numbness.  Psychiatric/Behavioral: Negative for agitation and behavioral problems.    Objective:   Today's Vitals: BP (!) 188/95 (BP Location: Right Arm, Patient Position: Sitting, Cuff Size: Normal)   Pulse 96   Temp 97.7 F (36.5 C) (Temporal)   Resp 18   Ht 5' 4"  (1.626 m)   Wt 186 lb (84.4 kg)   SpO2 99%   BMI 31.93 kg/m   Physical Exam Vitals reviewed.  Constitutional:      General: She is not in acute distress.    Appearance: She is not diaphoretic.  HENT:     Head: Normocephalic and atraumatic.     Nose: Nose normal.     Mouth/Throat:     Mouth: Mucous membranes are moist.  Eyes:     General: No scleral icterus.    Extraocular Movements: Extraocular movements intact.     Pupils: Pupils are equal, round, and reactive to light.  Cardiovascular:     Rate and Rhythm: Normal rate and regular rhythm.     Pulses: Normal pulses.     Heart sounds: Normal heart sounds. No murmur heard.   Pulmonary:     Breath sounds: Normal breath sounds. No wheezing or rales.  Abdominal:     Palpations: Abdomen is soft.     Tenderness: There is no abdominal tenderness.  Musculoskeletal:     Cervical back: Neck supple. No tenderness.     Right lower leg: No edema.     Left lower leg: No edema.  Skin:    General: Skin is warm.     Findings: No rash.  Neurological:     General: No focal deficit present.     Mental Status: She is alert and oriented to person, place, and time.     Sensory: No sensory deficit.     Motor: No weakness.  Psychiatric:        Mood and Affect: Mood normal.        Behavior: Behavior normal.     Assessment & Plan:   Problem List Items Addressed This Visit      Cardiovascular and Mediastinum   Malignant hypertension    BP Readings from Last 1 Encounters:  10/05/20 (!) 188/95   No symptoms of end-organ damage Started  Olmesartan-Amlodipine-HCTZ Counseled for compliance with the medications Advised DASH diet and moderate exercise/walking, at least 150 mins/week  EKG: Sinus rhythm. HR 93. Nonspecific ST and T wave abnormality.       Relevant Medications   Olmesartan-amLODIPine-HCTZ 40-5-25 MG TABS   Other Relevant Orders   CMP14+EGFR     Other   HOT FLASHES    Related to menopause Started Estradiol 1 mg QD      Relevant Medications  DULoxetine (CYMBALTA) 30 MG capsule   estradiol (ESTRACE) 1 MG tablet   Chronic low back pain    With occasional radiation to the left LE Advised simple back exercises Tylenol PRN Cymbalta for neuropathic pain      Relevant Medications   DULoxetine (CYMBALTA) 30 MG capsule   Encounter to establish care    Care established Previous chart reviewed History and medications reviewed with the patient       Other Visit Diagnoses    Establishing care with new doctor, encounter for    -  Primary   Relevant Orders   CBC   CMP14+EGFR   Lipid panel   TSH   VITAMIN D 25 Hydroxy (Vit-D Deficiency, Fractures)   Need for immunization against influenza       Relevant Orders   Flu Vaccine QUAD High Dose(Fluad) (Completed)   Screening mammogram, encounter for       Relevant Orders   MM Digital Screening   Special screening for malignant neoplasms, colon       Relevant Orders   Ambulatory referral to Gastroenterology   Post-menopausal       Relevant Orders   DG Bone Density      Outpatient Encounter Medications as of 10/05/2020  Medication Sig  . aspirin EC 81 MG tablet Take 81 mg by mouth daily.  . Iron-Vitamins (GERITOL COMPLETE) TABS Take 1 tablet by mouth daily.  . DULoxetine (CYMBALTA) 30 MG capsule Take 1 capsule (30 mg total) by mouth daily.  Marland Kitchen estradiol (ESTRACE) 1 MG tablet Take 1 tablet (1 mg total) by mouth daily.  Marland Kitchen ibuprofen (ADVIL,MOTRIN) 400 MG tablet Take 1 tablet (400 mg total) by mouth every 6 (six) hours as needed. (Patient not taking:  Reported on 10/05/2020)  . Olmesartan-amLODIPine-HCTZ 40-5-25 MG TABS Take 1 tablet by mouth daily.  . [DISCONTINUED] doxycycline (VIBRAMYCIN) 100 MG capsule Take 1 capsule (100 mg total) by mouth 2 (two) times daily. (Patient not taking: Reported on 10/05/2020)   No facility-administered encounter medications on file as of 10/05/2020.    Follow-up: Return in about 4 weeks (around 11/02/2020).   Lindell Spar, MD

## 2020-10-05 NOTE — Patient Instructions (Addendum)
Please start taking medications as prescribed.  Please follow DASH diet and perform moderate exercise at least 150 mins/week.  DASH stands for Dietary Approaches to Stop Hypertension. The DASH diet is a healthy-eating plan designed to help treat or prevent high blood pressure (hypertension).  The DASH diet includes foods that are rich in potassium, calcium and magnesium. These nutrients help control blood pressure. The diet limits foods that are high in sodium, saturated fat and added sugars.  Studies have shown that the DASH diet can lower blood pressure in as little as two weeks. The diet can also lower low-density lipoprotein (LDL or "bad") cholesterol levels in the blood. High blood pressure and high LDL cholesterol levels are two major risk factors for heart disease and stroke.    DASH diet: Recommended servings The DASH diet provides daily and weekly nutritional goals. The number of servings you should have depends on your daily calorie needs.  Here's a look at the recommended servings from each food group for a 2,000-calorie-a-day DASH diet:  Grains: 6 to 8 servings a day. One serving is one slice bread, 1 ounce dry cereal, or 1/2 cup cooked cereal, rice or pasta. Vegetables: 4 to 5 servings a day. One serving is 1 cup raw leafy green vegetable, 1/2 cup cut-up raw or cooked vegetables, or 1/2 cup vegetable juice. Fruits: 4 to 5 servings a day. One serving is one medium fruit, 1/2 cup fresh, frozen or canned fruit, or 1/2 cup fruit juice. Fat-free or low-fat dairy products: 2 to 3 servings a day. One serving is 1 cup milk or yogurt, or 1 1/2 ounces cheese. Lean meats, poultry and fish: six 1-ounce servings or fewer a day. One serving is 1 ounce cooked meat, poultry or fish, or 1 egg. Nuts, seeds and legumes: 4 to 5 servings a week. One serving is 1/3 cup nuts, 2 tablespoons peanut butter, 2 tablespoons seeds, or 1/2 cup cooked legumes (dried beans or peas). Fats and oils: 2 to 3 servings  a day. One serving is 1 teaspoon soft margarine, 1 teaspoon vegetable oil, 1 tablespoon mayonnaise or 2 tablespoons salad dressing. Sweets and added sugars: 5 servings or fewer a week. One serving is 1 tablespoon sugar, jelly or jam, 1/2 cup sorbet, or 1 cup lemonade.  --------------------------------------------------------------------------------------------------------------- Back Exercises These exercises help to make your trunk and back strong. They also help to keep the lower back flexible. Doing these exercises can help to prevent back pain or lessen existing pain.  If you have back pain, try to do these exercises 2-3 times each day or as told by your doctor.  As you get better, do the exercises once each day. Repeat the exercises more often as told by your doctor.  To stop back pain from coming back, do the exercises once each day, or as told by your doctor. Exercises Single knee to chest Do these steps 3-5 times in a row for each leg: 1. Lie on your back on a firm bed or the floor with your legs stretched out. 2. Bring one knee to your chest. 3. Grab your knee or thigh with both hands and hold them it in place. 4. Pull on your knee until you feel a gentle stretch in your lower back or buttocks. 5. Keep doing the stretch for 10-30 seconds. 6. Slowly let go of your leg and straighten it. Pelvic tilt Do these steps 5-10 times in a row: 1. Lie on your back on a firm bed or the  floor with your legs stretched out. 2. Bend your knees so they point up to the ceiling. Your feet should be flat on the floor. 3. Tighten your lower belly (abdomen) muscles to press your lower back against the floor. This will make your tailbone point up to the ceiling instead of pointing down to your feet or the floor. 4. Stay in this position for 5-10 seconds while you gently tighten your muscles and breathe evenly. Cat-cow Do these steps until your lower back bends more easily: 1. Get on your hands and  knees on a firm surface. Keep your hands under your shoulders, and keep your knees under your hips. You may put padding under your knees. 2. Let your head hang down toward your chest. Tighten (contract) the muscles in your belly. Point your tailbone toward the floor so your lower back becomes rounded like the back of a cat. 3. Stay in this position for 5 seconds. 4. Slowly lift your head. Let the muscles of your belly relax. Point your tailbone up toward the ceiling so your back forms a sagging arch like the back of a cow. 5. Stay in this position for 5 seconds.  Press-ups Do these steps 5-10 times in a row: 1. Lie on your belly (face-down) on the floor. 2. Place your hands near your head, about shoulder-width apart. 3. While you keep your back relaxed and keep your hips on the floor, slowly straighten your arms to raise the top half of your body and lift your shoulders. Do not use your back muscles. You may change where you place your hands in order to make yourself more comfortable. 4. Stay in this position for 5 seconds. 5. Slowly return to lying flat on the floor.  Bridges Do these steps 10 times in a row: 1. Lie on your back on a firm surface. 2. Bend your knees so they point up to the ceiling. Your feet should be flat on the floor. Your arms should be flat at your sides, next to your body. 3. Tighten your butt muscles and lift your butt off the floor until your waist is almost as high as your knees. If you do not feel the muscles working in your butt and the back of your thighs, slide your feet 1-2 inches farther away from your butt. 4. Stay in this position for 3-5 seconds. 5. Slowly lower your butt to the floor, and let your butt muscles relax. If this exercise is too easy, try doing it with your arms crossed over your chest. Belly crunches Do these steps 5-10 times in a row: 1. Lie on your back on a firm bed or the floor with your legs stretched out. 2. Bend your knees so they point  up to the ceiling. Your feet should be flat on the floor. 3. Cross your arms over your chest. 4. Tip your chin a little bit toward your chest but do not bend your neck. 5. Tighten your belly muscles and slowly raise your chest just enough to lift your shoulder blades a tiny bit off of the floor. Avoid raising your body higher than that, because it can put too much stress on your low back. 6. Slowly lower your chest and your head to the floor. Back lifts Do these steps 5-10 times in a row: 1. Lie on your belly (face-down) with your arms at your sides, and rest your forehead on the floor. 2. Tighten the muscles in your legs and your butt. 3. Slowly  lift your chest off of the floor while you keep your hips on the floor. Keep the back of your head in line with the curve in your back. Look at the floor while you do this. 4. Stay in this position for 3-5 seconds. 5. Slowly lower your chest and your face to the floor. Contact a doctor if:  Your back pain gets a lot worse when you do an exercise.  Your back pain does not get better 2 hours after you exercise. If you have any of these problems, stop doing the exercises. Do not do them again unless your doctor says it is okay. Get help right away if:  You have sudden, very bad back pain. If this happens, stop doing the exercises. Do not do them again unless your doctor says it is okay. This information is not intended to replace advice given to you by your health care provider. Make sure you discuss any questions you have with your health care provider. Document Revised: 08/02/2018 Document Reviewed: 08/02/2018 Elsevier Patient Education  2020 [employer].

## 2020-10-05 NOTE — Assessment & Plan Note (Signed)
With occasional radiation to the left LE Advised simple back exercises Tylenol PRN Cymbalta for neuropathic pain

## 2020-10-06 ENCOUNTER — Encounter (INDEPENDENT_AMBULATORY_CARE_PROVIDER_SITE_OTHER): Payer: Self-pay | Admitting: *Deleted

## 2020-10-06 DIAGNOSIS — I10 Essential (primary) hypertension: Secondary | ICD-10-CM | POA: Diagnosis not present

## 2020-10-06 DIAGNOSIS — Z7689 Persons encountering health services in other specified circumstances: Secondary | ICD-10-CM | POA: Diagnosis not present

## 2020-10-07 ENCOUNTER — Telehealth: Payer: Self-pay

## 2020-10-07 ENCOUNTER — Other Ambulatory Visit: Payer: Self-pay | Admitting: Internal Medicine

## 2020-10-07 DIAGNOSIS — E559 Vitamin D deficiency, unspecified: Secondary | ICD-10-CM

## 2020-10-07 DIAGNOSIS — E785 Hyperlipidemia, unspecified: Secondary | ICD-10-CM

## 2020-10-07 LAB — CMP14+EGFR
ALT: 20 IU/L (ref 0–32)
AST: 18 IU/L (ref 0–40)
Albumin/Globulin Ratio: 1.7 (ref 1.2–2.2)
Albumin: 4.3 g/dL (ref 3.8–4.8)
Alkaline Phosphatase: 73 IU/L (ref 44–121)
BUN/Creatinine Ratio: 12 (ref 12–28)
BUN: 9 mg/dL (ref 8–27)
Bilirubin Total: 0.6 mg/dL (ref 0.0–1.2)
CO2: 24 mmol/L (ref 20–29)
Calcium: 10.2 mg/dL (ref 8.7–10.3)
Chloride: 103 mmol/L (ref 96–106)
Creatinine, Ser: 0.74 mg/dL (ref 0.57–1.00)
GFR calc Af Amer: 95 mL/min/{1.73_m2} (ref >59)
GFR calc non Af Amer: 82 mL/min/{1.73_m2} (ref >59)
Globulin, Total: 2.5 g/dL (ref 1.5–4.5)
Glucose: 111 mg/dL — ABNORMAL HIGH (ref 65–99)
Potassium: 3.9 mmol/L (ref 3.5–5.2)
Sodium: 141 mmol/L (ref 134–144)
Total Protein: 6.8 g/dL (ref 6.0–8.5)

## 2020-10-07 LAB — CBC
Hematocrit: 39 % (ref 34.0–46.6)
Hemoglobin: 12.9 g/dL (ref 11.1–15.9)
MCH: 29.1 pg (ref 26.6–33.0)
MCHC: 33.1 g/dL (ref 31.5–35.7)
MCV: 88 fL (ref 79–97)
Platelets: 282 10*3/uL (ref 150–450)
RBC: 4.43 x10E6/uL (ref 3.77–5.28)
RDW: 11.7 % (ref 11.7–15.4)
WBC: 4.7 10*3/uL (ref 3.4–10.8)

## 2020-10-07 LAB — LIPID PANEL
Chol/HDL Ratio: 3.8 ratio (ref 0.0–4.4)
Cholesterol, Total: 240 mg/dL — ABNORMAL HIGH (ref 100–199)
HDL: 63 mg/dL (ref >39)
LDL Chol Calc (NIH): 145 mg/dL — ABNORMAL HIGH (ref 0–99)
Triglycerides: 177 mg/dL — ABNORMAL HIGH (ref 0–149)
VLDL Cholesterol Cal: 32 mg/dL (ref 5–40)

## 2020-10-07 LAB — VITAMIN D 25 HYDROXY (VIT D DEFICIENCY, FRACTURES): Vit D, 25-Hydroxy: 14.1 ng/mL — ABNORMAL LOW (ref 30.0–100.0)

## 2020-10-07 LAB — TSH: TSH: 1.14 u[IU]/mL (ref 0.450–4.500)

## 2020-10-07 MED ORDER — VITAMIN D (ERGOCALCIFEROL) 1.25 MG (50000 UNIT) PO CAPS: 50000.0000 [IU] | ORAL_CAPSULE | ORAL | 1 refills | Status: AC

## 2020-10-07 MED ORDER — ATORVASTATIN CALCIUM 20 MG PO TABS: 20.0000 mg | ORAL_TABLET | Freq: Every day | ORAL | 3 refills | Status: DC

## 2020-10-07 NOTE — Telephone Encounter (Signed)
Called [facility] they do have the prescription they need pt insurance needs to be on file for her to pick up pt advised with verbal understanding

## 2020-10-07 NOTE — Telephone Encounter (Signed)
Olmesartan-amLODIPine-HCTZ 40-5-25 MG TABS  Pt went to pick it up and it was not there

## 2020-10-12 ENCOUNTER — Telehealth: Payer: Self-pay | Admitting: Internal Medicine

## 2020-10-12 ENCOUNTER — Other Ambulatory Visit: Payer: Self-pay

## 2020-10-12 DIAGNOSIS — M545 Low back pain, unspecified: Secondary | ICD-10-CM

## 2020-10-12 DIAGNOSIS — I10 Essential (primary) hypertension: Secondary | ICD-10-CM

## 2020-10-12 DIAGNOSIS — E785 Hyperlipidemia, unspecified: Secondary | ICD-10-CM

## 2020-10-12 DIAGNOSIS — N951 Menopausal and female climacteric states: Secondary | ICD-10-CM

## 2020-10-12 MED ORDER — DULOXETINE HCL 30 MG PO CPEP: 30.0000 mg | ORAL_CAPSULE | Freq: Every day | ORAL | 3 refills | Status: AC

## 2020-10-12 MED ORDER — ESTRADIOL 1 MG PO TABS
1.0000 mg | ORAL_TABLET | Freq: Every day | ORAL | 2 refills | Status: AC
Start: 2020-10-12 — End: ?

## 2020-10-12 MED ORDER — ATORVASTATIN CALCIUM 20 MG PO TABS: 20.0000 mg | ORAL_TABLET | Freq: Every day | ORAL | 3 refills | Status: AC

## 2020-10-12 MED ORDER — OLMESARTAN-AMLODIPINE-HCTZ 40-5-25 MG PO TABS: 1.0000 | ORAL_TABLET | Freq: Every day | ORAL | 3 refills | Status: DC

## 2020-10-12 NOTE — Telephone Encounter (Signed)
Patient seen patel last week and was prescribed:  Atorvastatin Duloxetine Estradiol olmesartan-amlodipine   Patient has went to pick them up and [facility] dosent have any of them can we resend them  [facility] on [address]

## 2020-10-12 NOTE — Telephone Encounter (Signed)
Refills sent in

## 2020-10-19 NOTE — Progress Notes (Signed)
Pt advised of recommendation with verbal understanding  

## 2020-10-30 ENCOUNTER — Ambulatory Visit (HOSPITAL_COMMUNITY)
Admission: RE | Admit: 2020-10-30 | Discharge: 2020-10-30 | Disposition: A | Discharge status: Home / Self Care | Payer: Medicare Other | Source: Ambulatory Visit | Attending: Internal Medicine | Admitting: Internal Medicine

## 2020-10-30 ENCOUNTER — Other Ambulatory Visit: Payer: Self-pay

## 2020-10-30 DIAGNOSIS — Z13820 Encounter for screening for osteoporosis: Secondary | ICD-10-CM | POA: Insufficient documentation

## 2020-10-30 DIAGNOSIS — R29890 Loss of height: Secondary | ICD-10-CM | POA: Diagnosis not present

## 2020-10-30 DIAGNOSIS — Z1231 Encounter for screening mammogram for malignant neoplasm of breast: Secondary | ICD-10-CM | POA: Insufficient documentation

## 2020-10-30 DIAGNOSIS — M85852 Other specified disorders of bone density and structure, left thigh: Secondary | ICD-10-CM | POA: Insufficient documentation

## 2020-10-30 DIAGNOSIS — Z780 Asymptomatic menopausal state: Secondary | ICD-10-CM | POA: Insufficient documentation

## 2020-10-30 DIAGNOSIS — R634 Abnormal weight loss: Secondary | ICD-10-CM | POA: Diagnosis not present

## 2020-10-30 DIAGNOSIS — Z90710 Acquired absence of both cervix and uterus: Secondary | ICD-10-CM | POA: Diagnosis not present

## 2020-11-02 ENCOUNTER — Ambulatory Visit: Payer: Medicare Other | Admitting: Internal Medicine

## 2020-11-03 ENCOUNTER — Other Ambulatory Visit: Payer: Self-pay

## 2020-11-03 ENCOUNTER — Ambulatory Visit (INDEPENDENT_AMBULATORY_CARE_PROVIDER_SITE_OTHER): Payer: Medicare Other | Admitting: Internal Medicine

## 2020-11-03 ENCOUNTER — Encounter: Payer: Self-pay | Admitting: Internal Medicine

## 2020-11-03 VITALS — BP 147/88 | HR 98 | Temp 98.0°F | Resp 18 | Ht 65.5 in | Wt 182.1 lb

## 2020-11-03 DIAGNOSIS — I10 Essential (primary) hypertension: Secondary | ICD-10-CM | POA: Diagnosis not present

## 2020-11-03 DIAGNOSIS — N951 Menopausal and female climacteric states: Secondary | ICD-10-CM

## 2020-11-03 DIAGNOSIS — M545 Low back pain, unspecified: Secondary | ICD-10-CM

## 2020-11-03 DIAGNOSIS — G8929 Other chronic pain: Secondary | ICD-10-CM

## 2020-11-03 NOTE — Assessment & Plan Note (Signed)
BP Readings from Last 1 Encounters:  11/03/20 (!) 147/88   Continue Olmesartan-Amlodipine-HCTZ for now, says that she just had coffee before entering the office Counseled for compliance with the medications Advised DASH diet and moderate exercise/walking, at least 150 mins/week

## 2020-11-03 NOTE — Assessment & Plan Note (Signed)
Better with Estrace 1 mg QD and Cymbalta 30 mg QD

## 2020-11-03 NOTE — Progress Notes (Signed)
Established Patient Office Visit  Subjective:  Patient ID: Veronica Davis, female    DOB: Aug 08, 1951  Age: 69 y.o. MRN: [id number]  CC:  Chief Complaint  Patient presents with  . Follow-up    4 week follow up pt has been doing fine     HPI Veronica Davis is a 68 year old female with PMH of uncontrolled HTN, chronic low back pain, hot flashes and GERD who presents for follow up of her HTN and back pain.  Patient takes her medications regularly. Her BP was 147/88 in the office today. She just took coffee before entering the office. She denies headache, dizziness, chest pain, dyspnea or palpitations. She also takes Ibuprofen for her back pain. She is advised to take Tylenol instead of NSAIDs.  Her back pain and hot flashes are better with Cymbalta.  Patient reports taking Vitamin D tablets everyday, which was supposed to be once every week. She is advised to hold taking Vitamin D tablets for about a month.  Past Medical History:  Diagnosis Date  . GERD (gastroesophageal reflux disease)     Past Surgical History:  Procedure Laterality Date  . ABDOMINAL HYSTERECTOMY      History reviewed. No pertinent family history.  Social History   Socioeconomic History  . Marital status: Married    Spouse name: Not on file  . Number of children: Not on file  . Years of education: Not on file  . Highest education level: Not on file  Occupational History  . Not on file  Tobacco Use  . Smoking status: Never Smoker  . Smokeless tobacco: Never Used  Substance and Sexual Activity  . Alcohol use: No  . Drug use: No  . Sexual activity: Not on file  Other Topics Concern  . Not on file  Social History Narrative  . Not on file   Social Determinants of Health   Financial Resource Strain: Not on file  Food Insecurity: Not on file  Transportation Needs: Not on file  Physical Activity: Not on file  Stress: Not on file  Social Connections: Not on file  Intimate Partner  Violence: Not on file    Outpatient Medications Prior to Visit  Medication Sig Dispense Refill  . aspirin EC 81 MG tablet Take 81 mg by mouth daily.    Marland Kitchen atorvastatin (LIPITOR) 20 MG tablet Take 1 tablet (20 mg total) by mouth daily. 90 tablet 3  . DULoxetine (CYMBALTA) 30 MG capsule Take 1 capsule (30 mg total) by mouth daily. 30 capsule 3  . estradiol (ESTRACE) 1 MG tablet Take 1 tablet (1 mg total) by mouth daily. 30 tablet 2  . Iron-Vitamins (GERITOL COMPLETE) TABS Take 1 tablet by mouth daily.    . Olmesartan-amLODIPine-HCTZ 40-5-25 MG TABS Take 1 tablet by mouth daily. 30 tablet 3  . Vitamin D, Ergocalciferol, (DRISDOL) 1.25 MG (50000 UNIT) CAPS capsule Take 1 capsule (50,000 Units total) by mouth every 7 (seven) days. 12 capsule 1  . ibuprofen (ADVIL,MOTRIN) 400 MG tablet Take 1 tablet (400 mg total) by mouth every 6 (six) hours as needed. 30 tablet 0   No facility-administered medications prior to visit.    Allergies  Allergen Reactions  . Penicillins Swelling    ROS Review of Systems  Constitutional: Negative for chills and fever.  HENT: Negative for congestion, sinus pressure, sinus pain and sore throat.   Eyes: Negative for pain and discharge.  Respiratory: Negative for cough and shortness of breath.   Cardiovascular:  Negative for chest pain and palpitations.  Gastrointestinal: Negative for abdominal pain, constipation, diarrhea, nausea and vomiting.  Endocrine: Negative for polydipsia and polyuria.  Genitourinary: Negative for dysuria and hematuria.  Musculoskeletal: Positive for back pain. Negative for neck pain and neck stiffness.  Skin: Negative for rash.  Neurological: Negative for dizziness, syncope, weakness and numbness.  Psychiatric/Behavioral: Negative for agitation and behavioral problems.      Objective:    Physical Exam Vitals reviewed.  Constitutional:      General: She is not in acute distress.    Appearance: She is not diaphoretic.  HENT:      Head: Normocephalic and atraumatic.     Nose: Nose normal.     Mouth/Throat:     Mouth: Mucous membranes are moist.  Eyes:     General: No scleral icterus.    Extraocular Movements: Extraocular movements intact.     Pupils: Pupils are equal, round, and reactive to light.  Cardiovascular:     Rate and Rhythm: Normal rate and regular rhythm.     Pulses: Normal pulses.     Heart sounds: Normal heart sounds. No murmur heard.   Pulmonary:     Breath sounds: Normal breath sounds. No wheezing or rales.  Abdominal:     Palpations: Abdomen is soft.     Tenderness: There is no abdominal tenderness.  Musculoskeletal:     Cervical back: Neck supple. No tenderness.     Right lower leg: No edema.     Left lower leg: No edema.  Skin:    General: Skin is warm.     Findings: No rash.  Neurological:     General: No focal deficit present.     Mental Status: She is alert and oriented to person, place, and time.     Sensory: No sensory deficit.     Motor: No weakness.  Psychiatric:        Mood and Affect: Mood normal.        Behavior: Behavior normal.     BP (!) 147/88 (BP Location: Left Arm, Patient Position: Sitting, Cuff Size: Normal)   Pulse 98   Temp 98 F (36.7 C) (Oral)   Resp 18   Ht 5' 5.5" (1.664 m)   Wt 182 lb 1.9 oz (82.6 kg)   SpO2 96%   BMI 29.85 kg/m  Wt Readings from Last 3 Encounters:  11/03/20 182 lb 1.9 oz (82.6 kg)  10/05/20 186 lb (84.4 kg)  10/06/16 181 lb (82.1 kg)     Health Maintenance Due  Topic Date Due  . Hepatitis C Screening  Never done  . COVID-19 Vaccine (1) Never done  . TETANUS/TDAP  Never done  . MAMMOGRAM  Never done  . COLONOSCOPY  Never done  . PNA vac Low Risk Adult (1 of 2 - PCV13) Never done    There are no preventive care reminders to display for this patient.  Lab Results  Component Value Date   TSH 1.140 10/06/2020   Lab Results  Component Value Date   WBC 4.7 10/06/2020   HGB 12.9 10/06/2020   HCT 39.0 10/06/2020   MCV  88 10/06/2020   PLT 282 10/06/2020   Lab Results  Component Value Date   NA 141 10/06/2020   K 3.9 10/06/2020   CO2 24 10/06/2020   GLUCOSE 111 (H) 10/06/2020   BUN 9 10/06/2020   CREATININE 0.74 10/06/2020   BILITOT 0.6 10/06/2020   ALKPHOS 73 10/06/2020   AST  18 10/06/2020   ALT 20 10/06/2020   PROT 6.8 10/06/2020   ALBUMIN 4.3 10/06/2020   CALCIUM 10.2 10/06/2020   Lab Results  Component Value Date   CHOL 240 (H) 10/06/2020   Lab Results  Component Value Date   HDL 63 10/06/2020   Lab Results  Component Value Date   LDLCALC 145 (H) 10/06/2020   Lab Results  Component Value Date   TRIG 177 (H) 10/06/2020   Lab Results  Component Value Date   CHOLHDL 3.8 10/06/2020   No results found for: HGBA1C    Assessment & Plan:   Problem List Items Addressed This Visit      Cardiovascular and Mediastinum   Malignant hypertension - Primary    BP Readings from Last 1 Encounters:  11/03/20 (!) 147/88   Continue Olmesartan-Amlodipine-HCTZ for now, says that she just had coffee before entering the office Counseled for compliance with the medications Advised DASH diet and moderate exercise/walking, at least 150 mins/week        Other   HOT FLASHES    Better with Estrace 1 mg QD and Cymbalta 30 mg QD      Chronic low back pain    Continue simple back exercises Tylenol PRN, avoid NSAID due to HTN Cymbalta for neuropathic pain         No orders of the defined types were placed in this encounter.   Follow-up: Return in about 3 months (around 02/01/2021).    Anabel Halon, MD

## 2020-11-03 NOTE — Assessment & Plan Note (Signed)
Continue simple back exercises Tylenol PRN, avoid NSAID due to HTN Cymbalta for neuropathic pain

## 2020-11-03 NOTE — Patient Instructions (Addendum)
Please continue taking medications as prescribed.  Please continue to follow DASH diet and perform moderate exercise/walking at least 150 mins/week.  Please try to cut down coffee to 1 per day.  Please avoid taking Ibuprofen. Okay to take Tylenol as needed up to three times in a day for back pain.

## 2021-01-12 ENCOUNTER — Other Ambulatory Visit: Payer: Self-pay

## 2021-01-12 ENCOUNTER — Telehealth: Payer: Self-pay

## 2021-01-12 DIAGNOSIS — I10 Essential (primary) hypertension: Secondary | ICD-10-CM

## 2021-01-12 MED ORDER — OLMESARTAN-AMLODIPINE-HCTZ 40-5-25 MG PO TABS: 1.0000 | ORAL_TABLET | Freq: Every day | ORAL | 3 refills | Status: AC

## 2021-01-12 NOTE — Telephone Encounter (Signed)
Please send in Olmesartan-amLODIPine-HCTZ 40-5-25 MG TABS to the drugstore

## 2021-01-12 NOTE — Telephone Encounter (Signed)
Rx sent 

## 2021-02-01 ENCOUNTER — Ambulatory Visit: Payer: Medicare Other | Admitting: Internal Medicine

## 2021-02-01 NOTE — Telephone Encounter (Signed)
Pt no showed for appt this morning please set a visit up so this can be discussed

## 2021-02-01 NOTE — Telephone Encounter (Signed)
Patient spouse called the medicine to expensive needs alternative medicine call in.   Olmesartan-amLODIPine-HCTZ 40-5-25 MG TABS   Pharmacy: [id number] - [place], [place] - [address]

## 2021-02-01 NOTE — Telephone Encounter (Signed)
Called patient lvm to return call to schedule appt.

## 2021-02-10 ENCOUNTER — Encounter: Payer: Self-pay | Admitting: Internal Medicine

## 2021-02-10 ENCOUNTER — Ambulatory Visit: Payer: Medicare HMO | Admitting: Internal Medicine

## 2021-09-15 ENCOUNTER — Ambulatory Visit: Payer: Medicare HMO | Attending: Internal Medicine

## 2021-09-15 ENCOUNTER — Other Ambulatory Visit (HOSPITAL_BASED_OUTPATIENT_CLINIC_OR_DEPARTMENT_OTHER): Payer: Self-pay

## 2021-09-15 DIAGNOSIS — Z23 Encounter for immunization: Secondary | ICD-10-CM

## 2021-09-15 MED ORDER — MODERNA COVID-19 BIVAL BOOSTER 50 MCG/0.5ML IM SUSP
INTRAMUSCULAR | 0 refills | Status: AC
  Filled 2021-09-15: qty 0.5, 1d supply, fill #0

## 2021-09-15 NOTE — Progress Notes (Signed)
   Covid-19 Vaccination Clinic  Name:  Veronica Davis    MRN: [id number] DOB: 1951-02-21  09/15/2021  Veronica Davis was observed post Covid-19 immunization for 15 minutes without incident. She was provided with Vaccine Information Sheet and instruction to access the V-Safe system.   Veronica Davis was instructed to call 911 with any severe reactions post vaccine: Difficulty breathing  Swelling of face and throat  A fast heartbeat  A bad rash all over body  Dizziness and weakness   Immunizations Administered     Name Date Dose VIS Date Route   Moderna Covid-19 vaccine Bivalent Booster 09/15/2021  3:47 PM 0.5 mL 07/03/2021 Intramuscular   Manufacturer: Moderna   Lot: [id number]   NDC: 12197-588-32

## 2022-04-23 ENCOUNTER — Emergency Department (HOSPITAL_COMMUNITY): Payer: Medicare HMO

## 2022-04-23 ENCOUNTER — Other Ambulatory Visit: Payer: Self-pay

## 2022-04-23 ENCOUNTER — Emergency Department (HOSPITAL_COMMUNITY)
Admission: EM | Admit: 2022-04-23 | Discharge: 2022-04-23 | Disposition: A | Discharge status: Home / Self Care | Payer: Medicare HMO | Attending: Emergency Medicine | Admitting: Emergency Medicine

## 2022-04-23 ENCOUNTER — Encounter (HOSPITAL_COMMUNITY): Payer: Self-pay

## 2022-04-23 DIAGNOSIS — Z7982 Long term (current) use of aspirin: Secondary | ICD-10-CM | POA: Insufficient documentation

## 2022-04-23 DIAGNOSIS — S61422A Laceration with foreign body of left hand, initial encounter: Secondary | ICD-10-CM | POA: Diagnosis not present

## 2022-04-23 DIAGNOSIS — S61412A Laceration without foreign body of left hand, initial encounter: Secondary | ICD-10-CM | POA: Diagnosis not present

## 2022-04-23 DIAGNOSIS — W01110A Fall on same level from slipping, tripping and stumbling with subsequent striking against sharp glass, initial encounter: Secondary | ICD-10-CM | POA: Insufficient documentation

## 2022-04-23 DIAGNOSIS — S6992XA Unspecified injury of left wrist, hand and finger(s), initial encounter: Secondary | ICD-10-CM | POA: Diagnosis present

## 2022-04-23 HISTORY — DX: Hyperlipidemia, unspecified: E78.5

## 2022-04-23 HISTORY — DX: Essential (primary) hypertension: I10

## 2022-04-23 MED ORDER — LIDOCAINE HCL (PF) 2 % IJ SOLN
INTRAMUSCULAR | Status: AC
  Filled 2022-04-23: qty 20

## 2022-04-23 MED ORDER — HYDROCODONE-ACETAMINOPHEN 5-325 MG PO TABS
2.0000 | ORAL_TABLET | Freq: Once | ORAL | Status: AC
  Administered 2022-04-23: 2 via ORAL
  Filled 2022-04-23: qty 2

## 2022-04-23 MED ORDER — LIDOCAINE HCL (PF) 1 % IJ SOLN
INTRAMUSCULAR | Status: AC
  Filled 2022-04-23: qty 10

## 2022-04-23 MED ORDER — TRAMADOL HCL 50 MG PO TABS: 50.0000 mg | ORAL_TABLET | Freq: Two times a day (BID) | ORAL | 0 refills | Status: AC | PRN

## 2022-04-23 MED ORDER — CLINDAMYCIN HCL 150 MG PO CAPS: 300.0000 mg | ORAL_CAPSULE | Freq: Three times a day (TID) | ORAL | 0 refills | Status: AC

## 2022-04-23 NOTE — ED Provider Notes (Signed)
[facility] EMERGENCY DEPARTMENT Provider Note   CSN: [id number] Arrival date & time: 04/23/22  1943     History  Chief Complaint  Patient presents with   Laceration    L hand    Veronica Davis is a 71 y.o. female.   Laceration  This patient is a pleasant 71 year old female presenting to the hospital with a complaint of a laceration to the left palm, this occurred just prior to arrival when she fell with any bottle in her hand, the glass bottle broke causing a laceration, she pulled out a large chunk of glass at the scene which caused the bleeding to intensify, she wrapped it and came immediately to the emergency department.  Up-to-date on tetanus.  Home Medications Prior to Admission medications   Medication Sig Start Date End Date Taking? Authorizing Provider  traMADol (ULTRAM) 50 MG tablet Take 1 tablet (50 mg total) by mouth every 12 (twelve) hours as needed. 04/23/22  Yes Noemi Chapel, MD  aspirin EC 81 MG tablet Take 81 mg by mouth daily.    [provider]  atorvastatin (LIPITOR) 20 MG tablet Take 1 tablet (20 mg total) by mouth daily. 10/12/20   Lindell Spar, MD  COVID-19 mRNA bivalent vaccine, Moderna, (MODERNA COVID-19 BIVAL BOOSTER) 50 MCG/0.5ML injection Inject into the muscle. 09/15/21   Carlyle Basques, MD  DULoxetine (CYMBALTA) 30 MG capsule Take 1 capsule (30 mg total) by mouth daily. 10/12/20   Lindell Spar, MD  estradiol (ESTRACE) 1 MG tablet Take 1 tablet (1 mg total) by mouth daily. 10/12/20   Lindell Spar, MD  Iron-Vitamins (GERITOL COMPLETE) TABS Take 1 tablet by mouth daily.    [provider]  Olmesartan-amLODIPine-HCTZ 40-5-25 MG TABS Take 1 tablet by mouth daily. 01/12/21   Lindell Spar, MD  Vitamin D, Ergocalciferol, (DRISDOL) 1.25 MG (50000 UNIT) CAPS capsule Take 1 capsule (50,000 Units total) by mouth every 7 (seven) days. 10/07/20   Lindell Spar, MD      Allergies    Penicillins    Review of Systems   Review of  Systems  Skin:  Positive for wound.   Physical Exam Updated Vital Signs BP (!) 113/94   Pulse (!) 117   Temp 98.7 F (37.1 C)   Resp 20   Ht 1.702 m (5\' 7" )   Wt 77.1 kg   SpO2 99%   BMI 26.63 kg/m  Physical Exam Vitals and nursing note reviewed.  Constitutional:      Appearance: She is well-developed. She is not diaphoretic.  HENT:     Head: Normocephalic and atraumatic.  Eyes:     General:        Right eye: No discharge.        Left eye: No discharge.     Conjunctiva/sclera: Conjunctivae normal.  Pulmonary:     Effort: Pulmonary effort is normal. No respiratory distress.  Skin:    General: Skin is warm and dry.     Findings: No erythema or rash.     Comments: 3 cm laceration to the hyperthenar aspect of the left palm, the flexor function is completely intact in all the fingers of the hand.  The wound was explored in a bloodless field and no foreign bodies were appreciated.  This was done twice, imaging was obtained  Neurological:     Mental Status: She is alert.     Coordination: Coordination normal.     Comments: Totally normal sensation to all  fingers of the hand    ED Results / Procedures / Treatments   Labs (all labs ordered are listed, but only abnormal results are displayed) Labs Reviewed - No data to display  EKG None  Radiology DG Hand 2 View Left  Result Date: 04/23/2022 CLINICAL DATA:  Foreign Body. Reports laceration to Left palm, area of 4th metacarpal Fell with a glass in her hand and had glass in hand that she pulled out. Resp even and unlabored. Skin warm and dry. EXAM: LEFT HAND - 2 VIEW COMPARISON:  None Available. FINDINGS: There is no evidence of fracture or dislocation. There is no evidence of arthropathy or other focal bone abnormality. Couple of thin linear densities within the volar soft tissues of the hand at the level of the fourth and fifth metacarpals: Measuring 0.7cm and 0.4 cm best evaluated on the lateral view. IMPRESSION: 1. Couple of  thin linear densities within the volar soft tissues of the hand at the level of the fourth and fifth metacarpals: Measuring 0.7cm and 0.4 cm best evaluated on the lateral view. Findings consistent with retained foreign body status post fall on glass. 2.  No acute displaced fracture or dislocation. Electronically Signed   By: Iven Finn M.D.   On: 04/23/2022 20:49    Procedures .Marland KitchenLaceration Repair  Date/Time: 04/23/2022 8:51 PM Performed by: Noemi Chapel, MD Authorized by: Noemi Chapel, MD   Consent:    Consent obtained:  Verbal   Consent given by:  Patient   Risks discussed:  Infection, pain, need for additional repair, poor cosmetic result and poor wound healing   Alternatives discussed:  No treatment and delayed treatment Universal protocol:    Procedure explained and questions answered to patient or proxy's satisfaction: yes     Relevant documents present and verified: yes     Imaging studies available: yes     Site/side marked: yes     Immediately prior to procedure, a time out was called: yes     Patient identity confirmed:  Verbally with patient Anesthesia:    Anesthesia method:  Local infiltration   Local anesthetic:  Lidocaine 1% w/o epi Laceration details:    Location:  Hand   Hand location:  L palm   Length (cm):  3   Depth (mm):  6 Pre-procedure details:    Preparation:  Patient was prepped and draped in usual sterile fashion and imaging obtained to evaluate for foreign bodies Exploration:    Hemostasis achieved with:  Direct pressure   Imaging obtained: x-ray     Imaging outcome: foreign body noted     Wound exploration: wound explored through full range of motion and entire depth of wound visualized     Wound extent: no fascia violation noted, no foreign bodies/material noted, no muscle damage noted, no nerve damage noted, no tendon damage noted, no underlying fracture noted and no vascular damage noted   Treatment:    Area cleansed with:  Povidone-iodine    Amount of cleaning:  Extensive   Irrigation solution:  Sterile saline   Irrigation volume:  1000   Irrigation method:  Syringe   Visualized foreign bodies/material removed: no     Debridement:  None Skin repair:    Repair method:  Sutures   Suture size:  4-0   Suture material:  Prolene   Suture technique:  Simple interrupted   Number of sutures:  6 Approximation:    Approximation:  Close Repair type:    Repair type:  Simple  Post-procedure details:    Dressing:  Antibiotic ointment and sterile dressing   Procedure completion:  Tolerated well, no immediate complications Comments:          Medications Ordered in ED Medications  lidocaine (PF) (XYLOCAINE) 1 % injection (has no administration in time range)  lidocaine HCl (PF) (XYLOCAINE) 2 % injection (has no administration in time range)  HYDROcodone-acetaminophen (NORCO/VICODIN) 5-325 MG per tablet 2 tablet (has no administration in time range)    ED Course/ Medical Decision Making/ A&P                           Medical Decision Making Amount and/or Complexity of Data Reviewed Radiology: ordered.   The wound was bleeding quite heavily, for this reason a tourniquet was placed on the upper extremity and bleeding was controlled.  Under direct visualization the wound was examined and probed, there was no foreign body seen.  Imaging did show tiny glass fragments most likely, these were not appreciated.  I discussed the case with Dr. Arther Abbott who is on-call for hand surgery and he recommends closing the wound and follow-up in the office without any further probing as this could cause more damage at this point.  The patient had 6 sutures placed, she was stable for discharge, hemostasis was achieved, blood pressure improved, heart rate improved.  Patient is stable for discharge        Final Clinical Impression(s) / ED Diagnoses Final diagnoses:  Laceration of left hand with foreign body, initial encounter    Rx / DC  Orders ED Discharge Orders          Ordered    traMADol (ULTRAM) 50 MG tablet  Every 12 hours PRN        04/23/22 2053              Noemi Chapel, MD 04/23/22 2054

## 2022-04-23 NOTE — Discharge Instructions (Signed)
The stitches will need to come out within the next 7 to 14 days, this should be done by the surgeon, Dr. Romeo Apple, you will need to follow-up closely in the office, call Monday morning to make an appointment, you will need to be seen within the next 7 to 10 days.  ER for severe or worsening pain fever drainage pus or spreading redness.  Keep the dressing in place for 24 hours, this can be changed once every 24 hours

## 2022-04-23 NOTE — ED Triage Notes (Signed)
Reports laceration to L hand.  Fell with a glass in her hand and had glass in hand that she pulled out.  Resp even and unlabored.  Skin warm and dry.

## 2022-05-06 ENCOUNTER — Encounter: Payer: Self-pay | Admitting: Orthopedic Surgery

## 2022-05-06 ENCOUNTER — Ambulatory Visit: Payer: Medicare HMO | Admitting: Orthopedic Surgery

## 2022-05-06 DIAGNOSIS — S61412A Laceration without foreign body of left hand, initial encounter: Secondary | ICD-10-CM

## 2022-05-06 NOTE — Progress Notes (Signed)
New Patient Visit  Assessment: Veronica Davis is a 71 y.o. female with the following: 1. Laceration of left hand without foreign body, initial encounter  Plan: Veronica Davis has a laceration on the palmar aspect of the left hand.  This was reapproximated with sutures in the emergency department.  Sutures were removed today.  She has completed a course of antibiotics.  No fevers or chills.  No signs or symptoms of infection.  Decreased sensation to the ulnar aspect of the left small finger, but no issues otherwise.  She will contact the clinic if she has any issues.  Follow-up as needed.  Follow-up: Return if symptoms worsen or fail to improve.  Subjective:  Chief Complaint  Patient presents with   Hand Injury    LT hand/ DOI 04/23/22 Sutures removed/ steries placed    History of Present Illness: Veronica Davis is a 71 y.o. female who presents for evaluation of a left hand laceration.  Approximately 2 weeks ago, she was walking with a glass bottle in her hand, when she fell.  She sustained a laceration to the palmar aspect of the hand.  She reports that there was a shard of glass that she removed prior to presentation to the emergency department.  In the ED, the hand was irrigated, and sutured accordingly.  She has completed a course of antibiotics.  She reports some numbness and tingling to her small finger.   Review of Systems: No fevers or chills + numbness and tingling No chest pain No shortness of breath No bowel or bladder dysfunction No GI distress No headaches   Medical History:  Past Medical History:  Diagnosis Date   GERD (gastroesophageal reflux disease)    HLD (hyperlipidemia)    Hypertension     Past Surgical History:  Procedure Laterality Date   ABDOMINAL HYSTERECTOMY      No family history on file. Social History   Tobacco Use   Smoking status: Never   Smokeless tobacco: Never  Substance Use Topics   Alcohol use: No   Drug use: No     Allergies  Allergen Reactions   Penicillins Swelling    Current Meds  Medication Sig   aspirin EC 81 MG tablet Take 81 mg by mouth daily.   clindamycin (CLEOCIN) 150 MG capsule Take 2 capsules (300 mg total) by mouth 3 (three) times daily. May dispense as 150mg  capsules   COVID-19 mRNA bivalent vaccine, Moderna, (MODERNA COVID-19 BIVAL BOOSTER) 50 MCG/0.5ML injection Inject into the muscle.   traMADol (ULTRAM) 50 MG tablet Take 1 tablet (50 mg total) by mouth every 12 (twelve) hours as needed.    Objective: There were no vitals taken for this visit.  Physical Exam:  General: Alert and oriented. and No acute distress. Gait: Normal gait.  Left hand with a healing laceration.  Mild scabbing appreciated.  Slightly decreased sensation to the ulnar aspect of the small finger..  She has full range of motion otherwise.  IMAGING: No new imaging obtained today   New Medications:  No orders of the defined types were placed in this encounter.     , MD  05/06/2022 1:35 PM

## 2022-10-28 IMAGING — MG DIGITAL SCREENING BILAT W/ TOMO W/ CAD
6 of 10 series · 6 of 30 positions shown · non-contrast
Comparison: None.

CLINICAL DATA: Screening.

EXAM:
DIGITAL SCREENING BILATERAL MAMMOGRAM WITH TOMO AND CAD

[R MLO synth-2D (1 of 2)]
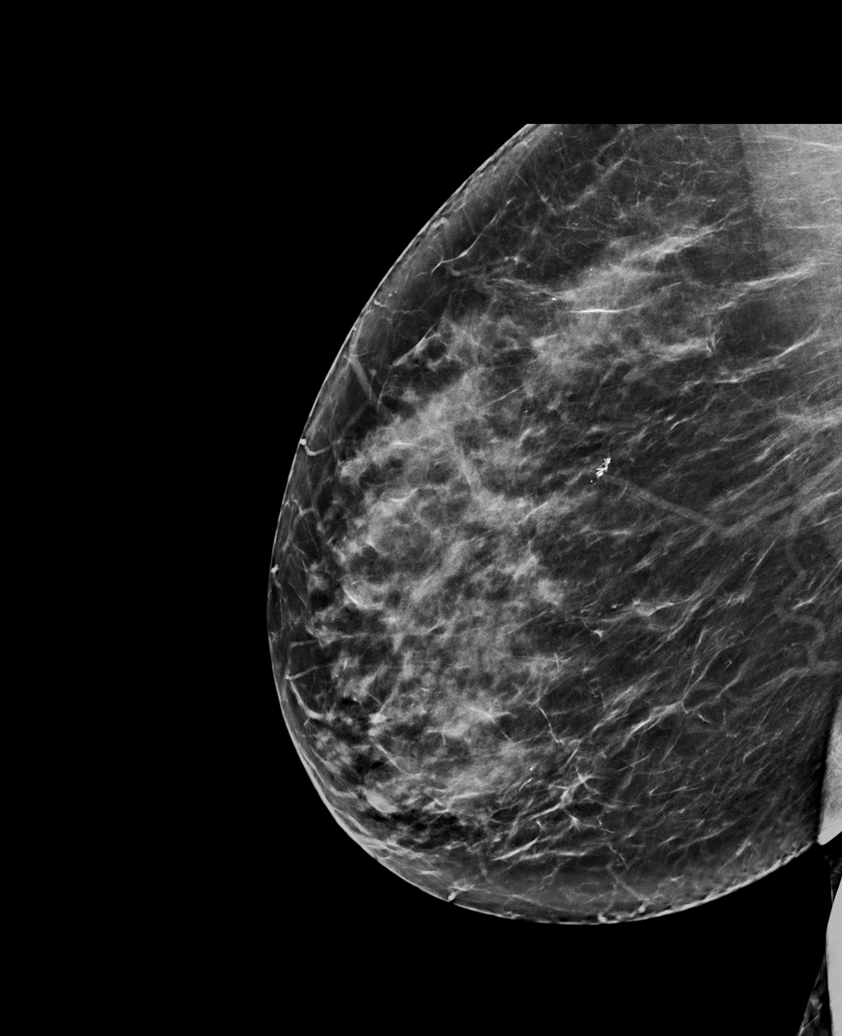

[L MLO synth-2D]
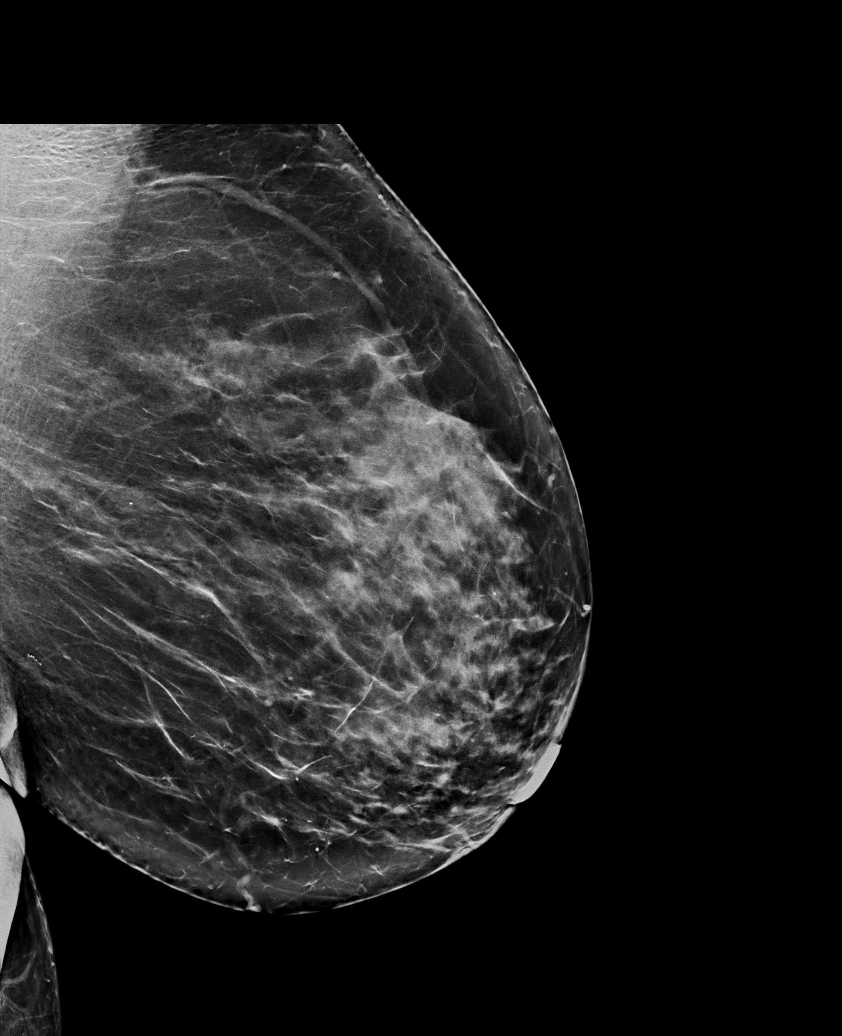

[R CC synth-2D]
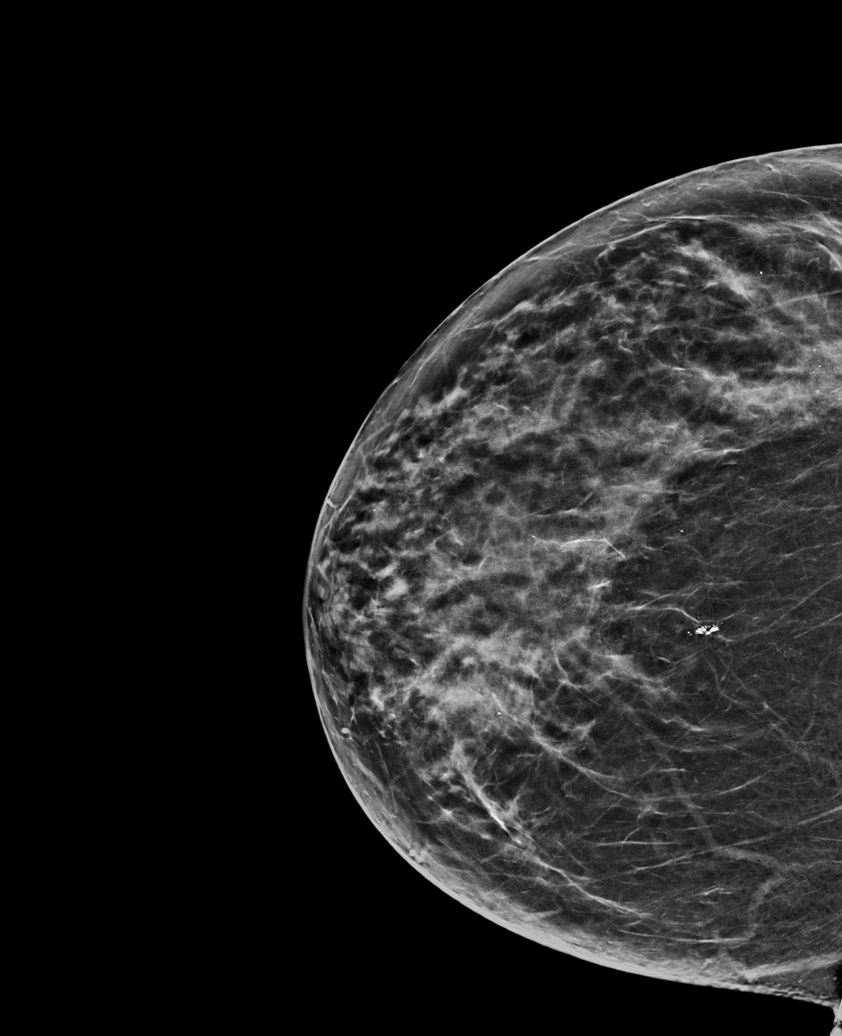

[L CC synth-2D]
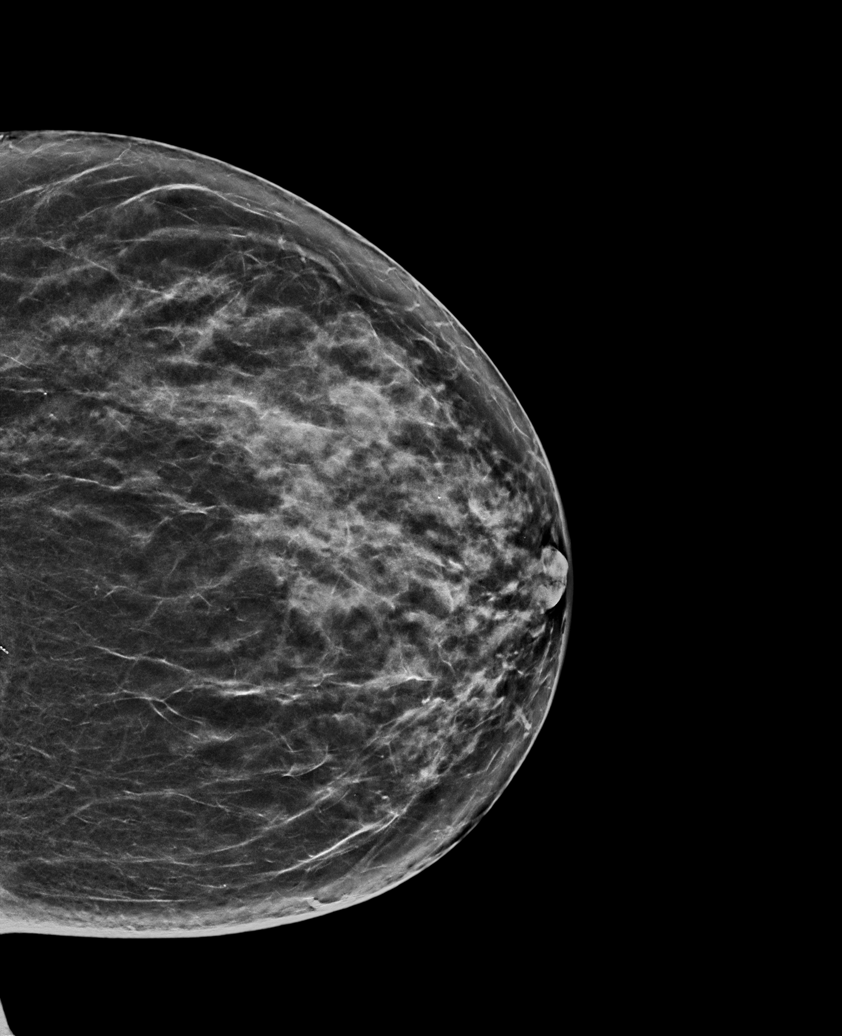

[R MLO synth-2D (2 of 2)]
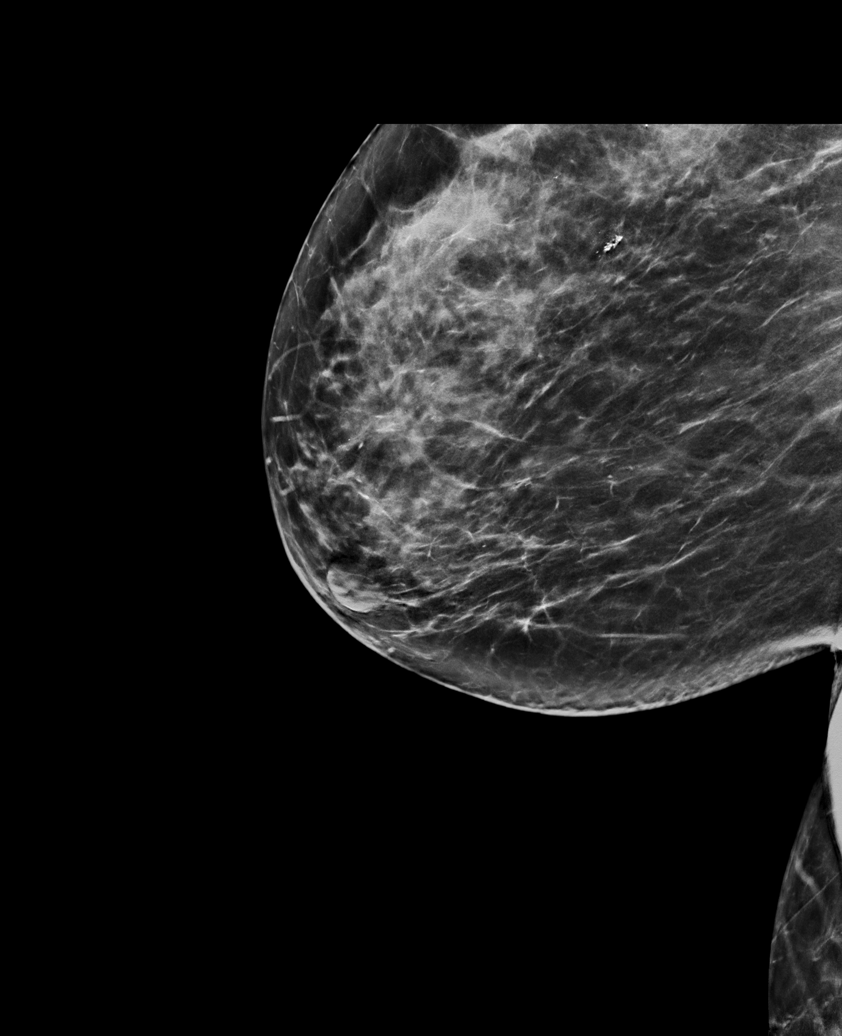

[R MLO tomo · tomo slice 41/82.0]
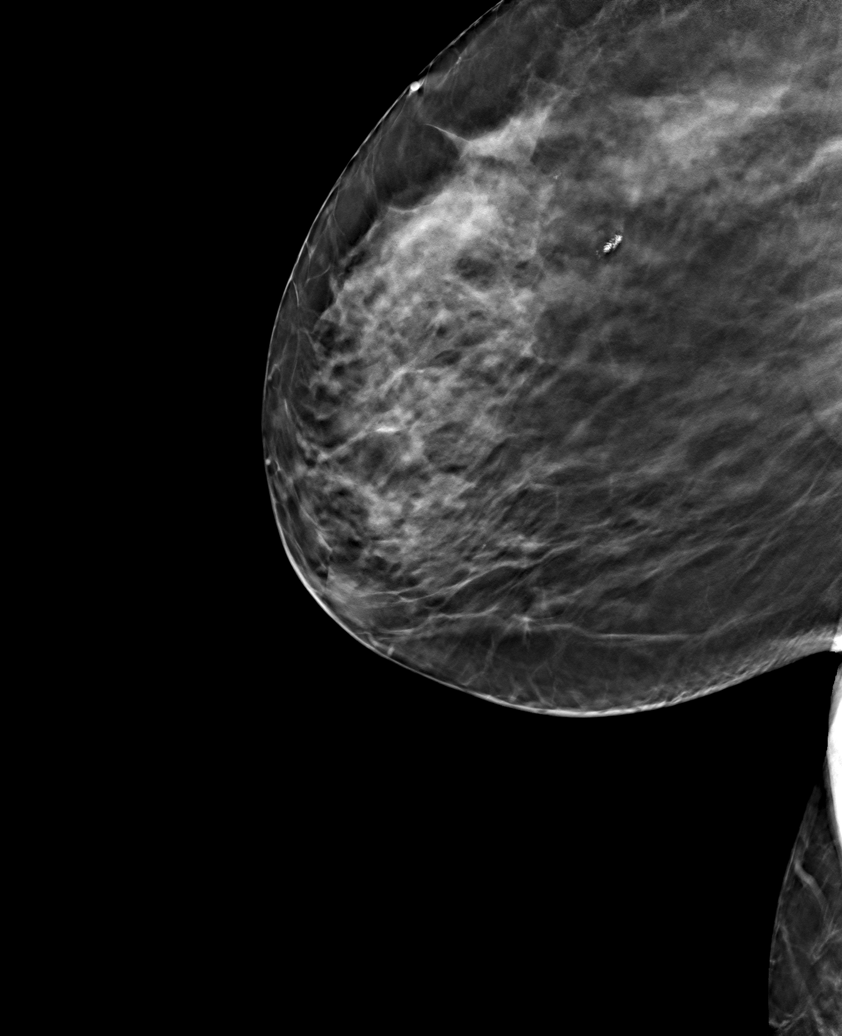

[6 of 30 positions shown; findings below may reference images not displayed]

ACR Breast Density Category c: The breast tissue is heterogeneously
dense, which may obscure small masses
FINDINGS: There are no findings suspicious for malignancy. Images were
processed with CAD.
IMPRESSION: No mammographic evidence of malignancy. A result letter of this
screening mammogram will be mailed directly to the patient.

RECOMMENDATION:
Screening mammogram in one year. (Code:EM-2-IHY)

BI-RADS CATEGORY  1: Negative.

## 2022-11-17 DIAGNOSIS — E663 Overweight: Secondary | ICD-10-CM | POA: Diagnosis not present

## 2022-11-17 DIAGNOSIS — R030 Elevated blood-pressure reading, without diagnosis of hypertension: Secondary | ICD-10-CM | POA: Diagnosis not present

## 2022-11-17 DIAGNOSIS — Z6828 Body mass index (BMI) 28.0-28.9, adult: Secondary | ICD-10-CM | POA: Diagnosis not present

## 2022-11-17 DIAGNOSIS — M25561 Pain in right knee: Secondary | ICD-10-CM | POA: Diagnosis not present

## 2023-05-26 DIAGNOSIS — U071 COVID-19: Secondary | ICD-10-CM | POA: Diagnosis not present

## 2023-05-26 DIAGNOSIS — E663 Overweight: Secondary | ICD-10-CM | POA: Diagnosis not present

## 2023-05-26 DIAGNOSIS — Z6829 Body mass index (BMI) 29.0-29.9, adult: Secondary | ICD-10-CM | POA: Diagnosis not present

## 2024-04-20 IMAGING — DX DG HAND 2V*L*
2 series · 4 of 4 positions shown · non-contrast
Comparison: None Available.

CLINICAL DATA: Foreign Body. Reports laceration to Left palm, area
of 4th metacarpal Fell with a glass in her hand and had glass in
hand that she pulled out. Resp even and unlabored. Skin warm and
dry.

EXAM:
LEFT HAND - 2 VIEW

[hand ap]
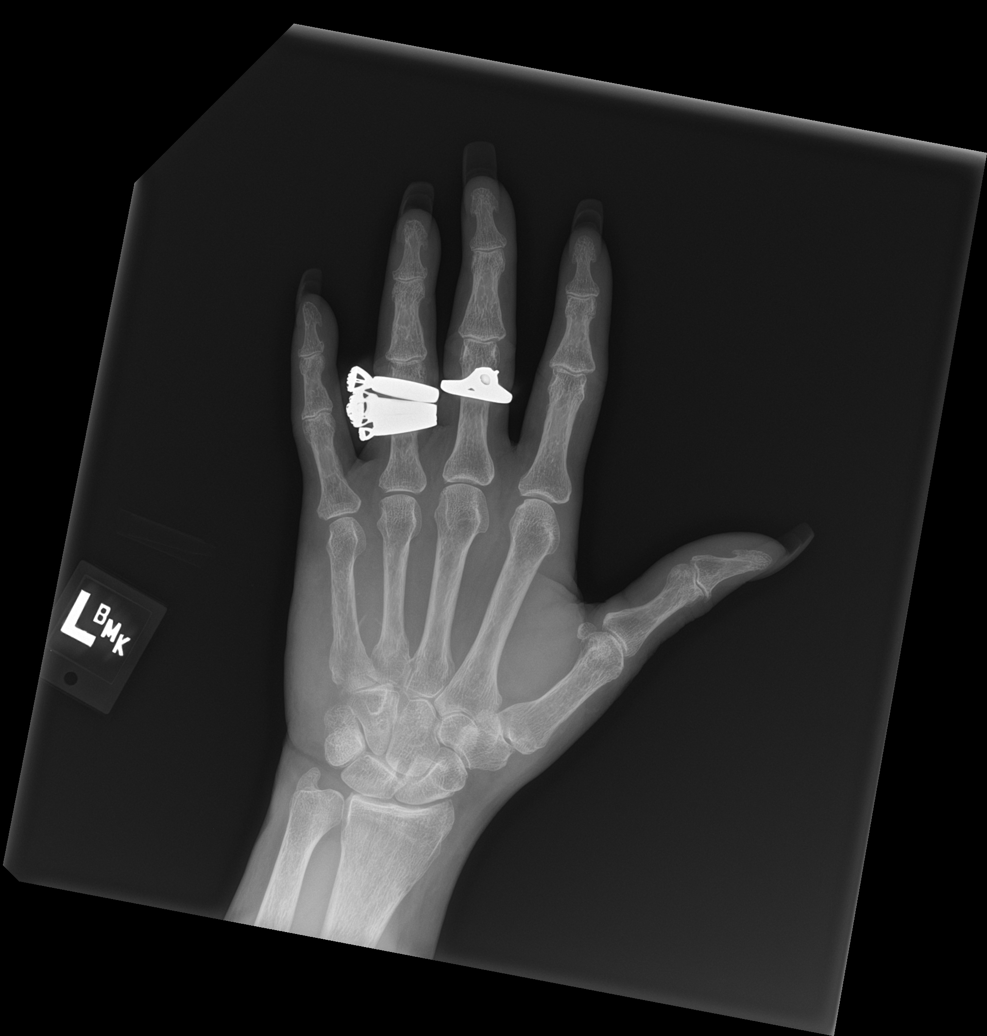

[Series 2: hand lat · 0.14mm/px · 3 of 3 slices shown]
[im 1/3]
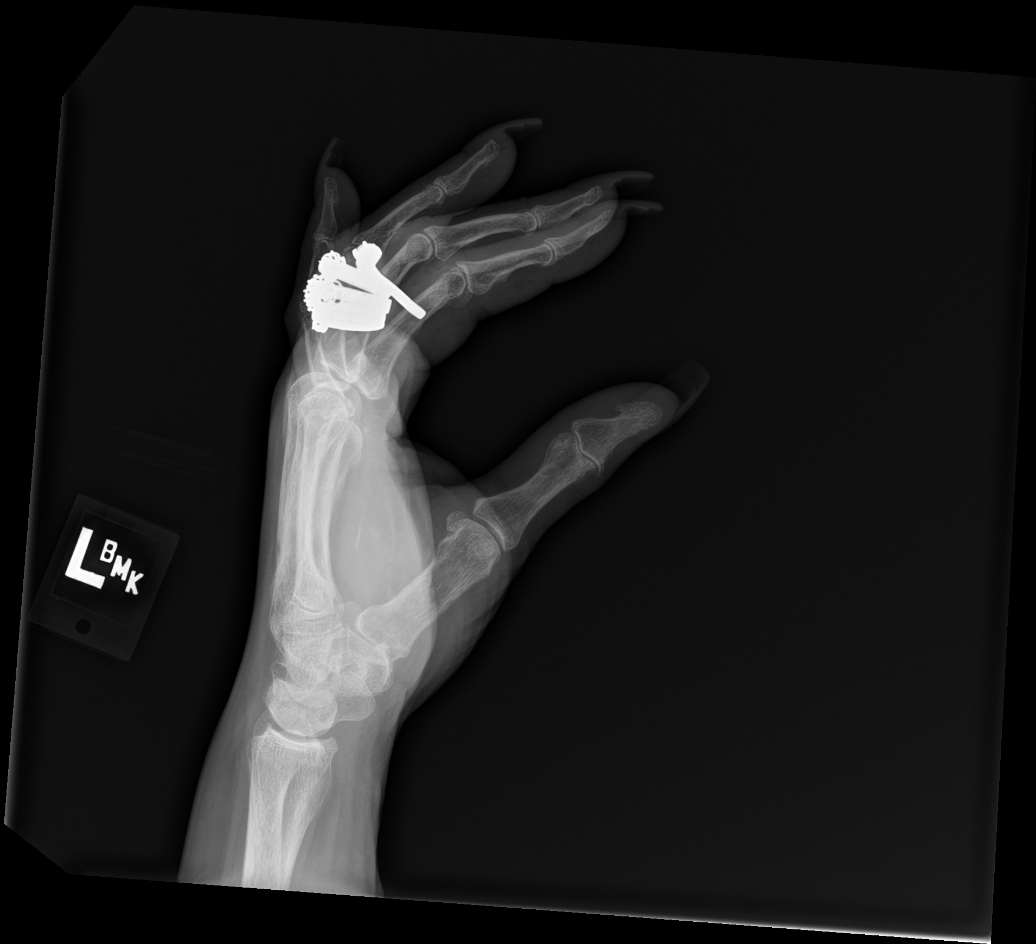
[im 2/3]
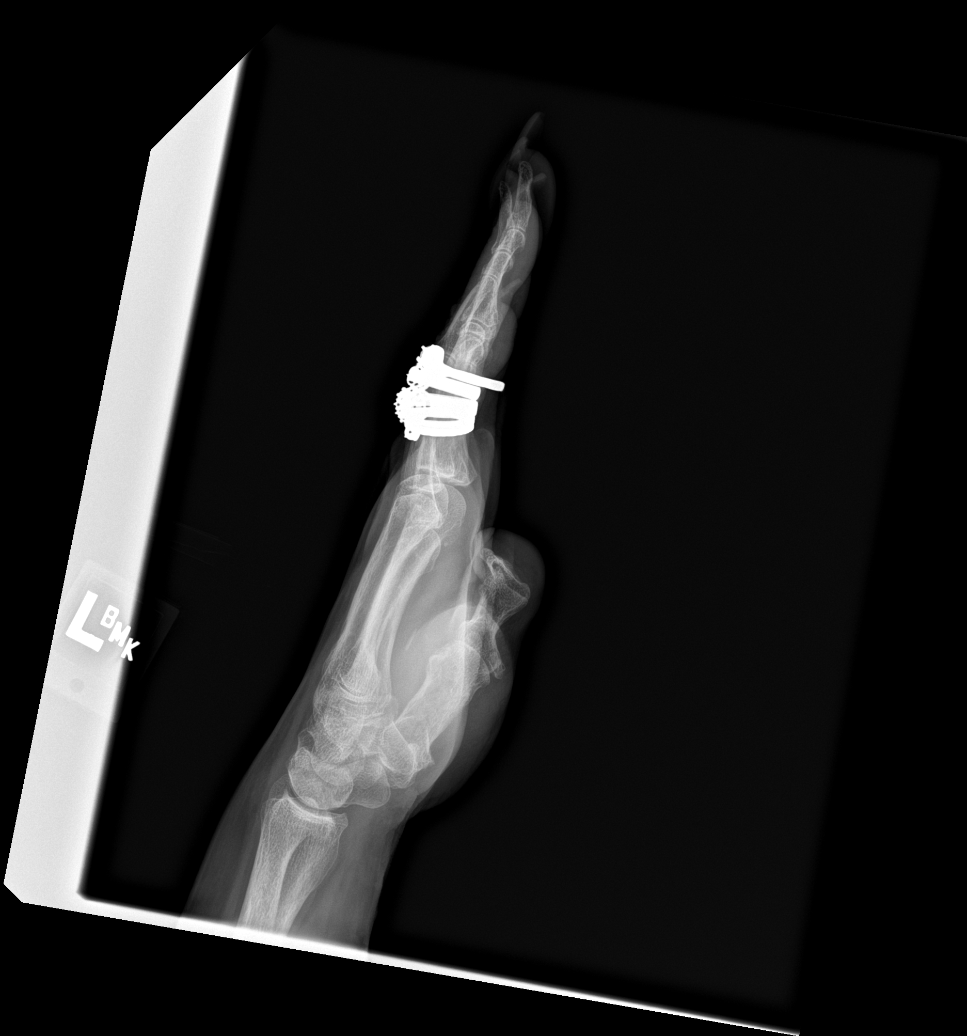
[im 3/3]
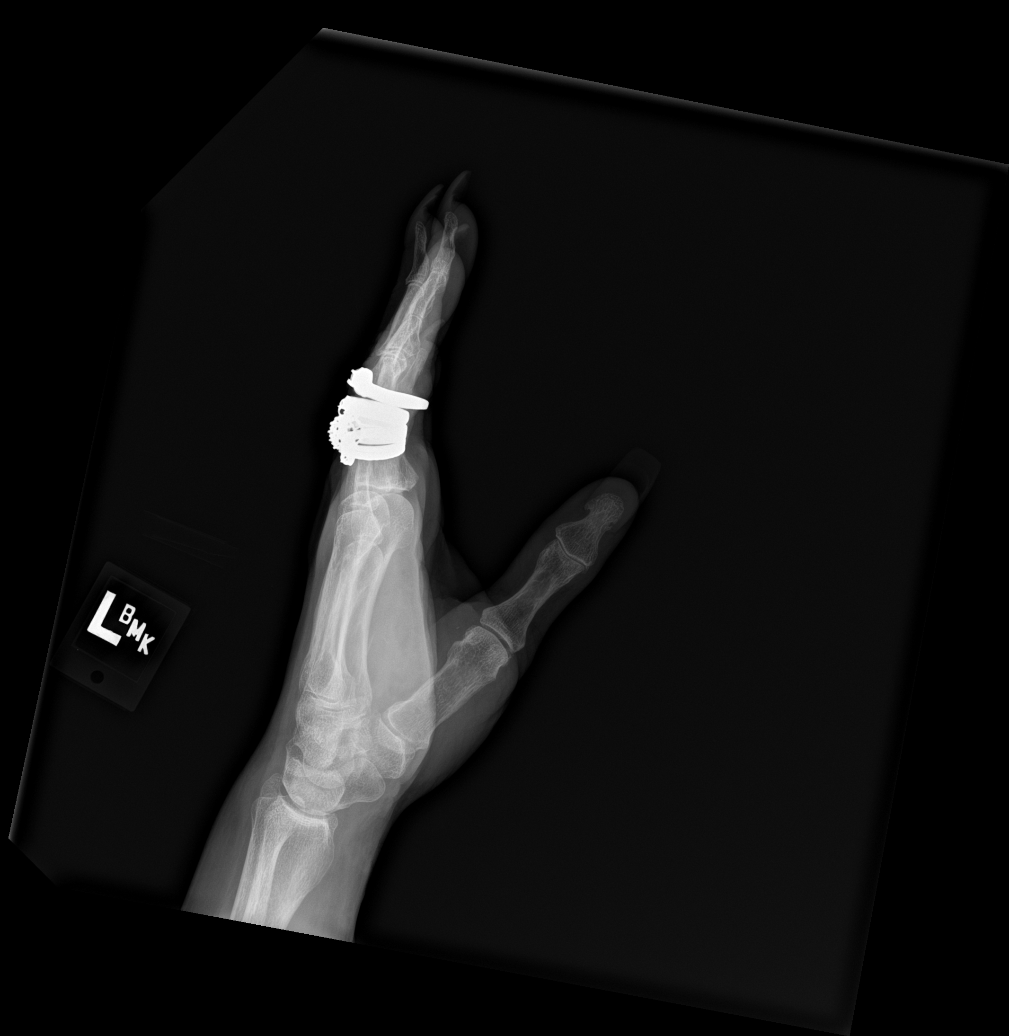

[4 of 4 positions shown; findings below may reference images not displayed]

FINDINGS: There is no evidence of fracture or dislocation. There is no
evidence of arthropathy or other focal bone abnormality. Couple of
thin linear densities within the volar soft tissues of the hand at
the level of the fourth and fifth metacarpals: Measuring 0.7cm and
0.4 cm best evaluated on the lateral view.
IMPRESSION: 1. Couple of thin linear densities within the volar soft tissues of
the hand at the level of the fourth and fifth metacarpals: Measuring
0.7cm and 0.4 cm best evaluated on the lateral view. Findings
consistent with retained foreign body status post fall on glass.
2.  No acute displaced fracture or dislocation.

## 2024-08-27 ENCOUNTER — Encounter: Admitting: Orthopedic Surgery
